# Patient Record
Sex: Female | Born: 1955
Health system: Southern US, Community
[De-identification: ages and names within clinical notes are randomized; demographics above are authoritative.]

## PROBLEM LIST (undated history)

## (undated) DIAGNOSIS — R51 Headache: Secondary | ICD-10-CM

## (undated) DIAGNOSIS — M1990 Unspecified osteoarthritis, unspecified site: Secondary | ICD-10-CM

## (undated) DIAGNOSIS — R269 Unspecified abnormalities of gait and mobility: Secondary | ICD-10-CM

## (undated) DIAGNOSIS — E039 Hypothyroidism, unspecified: Secondary | ICD-10-CM

## (undated) DIAGNOSIS — I10 Essential (primary) hypertension: Secondary | ICD-10-CM

## (undated) DIAGNOSIS — G4733 Obstructive sleep apnea (adult) (pediatric): Secondary | ICD-10-CM

## (undated) DIAGNOSIS — J189 Pneumonia, unspecified organism: Secondary | ICD-10-CM

## (undated) DIAGNOSIS — Z91199 Patient's noncompliance with other medical treatment and regimen due to unspecified reason: Secondary | ICD-10-CM

## (undated) DIAGNOSIS — I209 Angina pectoris, unspecified: Secondary | ICD-10-CM

## (undated) DIAGNOSIS — E785 Hyperlipidemia, unspecified: Secondary | ICD-10-CM

## (undated) DIAGNOSIS — IMO0001 Reserved for inherently not codable concepts without codable children: Secondary | ICD-10-CM

## (undated) DIAGNOSIS — I890 Lymphedema, not elsewhere classified: Secondary | ICD-10-CM

## (undated) DIAGNOSIS — R519 Headache, unspecified: Secondary | ICD-10-CM

## (undated) HISTORY — PX: SHOULDER SURGERY: SHX246

## (undated) HISTORY — DX: Patient's noncompliance with other medical treatment and regimen due to unspecified reason: Z91.199

## (undated) HISTORY — DX: Unspecified abnormalities of gait and mobility: R26.9

## (undated) HISTORY — PX: FOOT ARTHRODESIS, MODIFIED MCBRIDE: SUR52

## (undated) HISTORY — DX: Morbid (severe) obesity due to excess calories: E66.01

## (undated) HISTORY — DX: Unspecified osteoarthritis, unspecified site: M19.90

## (undated) HISTORY — PX: JOINT REPLACEMENT: SHX530

## (undated) HISTORY — DX: Obstructive sleep apnea (adult) (pediatric): G47.33

## (undated) HISTORY — PX: FOOT SURGERY: SHX648

## (undated) HISTORY — DX: Lymphedema, not elsewhere classified: I89.0

## (undated) HISTORY — PX: CARPAL TUNNEL RELEASE: SHX101

## (undated) HISTORY — PX: KNEE SURGERY: SHX244

## (undated) HISTORY — PX: ROTATOR CUFF REPAIR: SHX139

---

## 2012-09-18 DIAGNOSIS — K219 Gastro-esophageal reflux disease without esophagitis: Secondary | ICD-10-CM | POA: Insufficient documentation

## 2013-02-24 DIAGNOSIS — R519 Headache, unspecified: Secondary | ICD-10-CM | POA: Insufficient documentation

## 2014-02-03 DIAGNOSIS — F419 Anxiety disorder, unspecified: Secondary | ICD-10-CM | POA: Insufficient documentation

## 2014-02-03 DIAGNOSIS — R202 Paresthesia of skin: Secondary | ICD-10-CM | POA: Insufficient documentation

## 2014-02-03 DIAGNOSIS — G479 Sleep disorder, unspecified: Secondary | ICD-10-CM | POA: Insufficient documentation

## 2014-02-03 DIAGNOSIS — M21622 Bunionette of left foot: Secondary | ICD-10-CM | POA: Insufficient documentation

## 2014-02-03 DIAGNOSIS — D126 Benign neoplasm of colon, unspecified: Secondary | ICD-10-CM | POA: Insufficient documentation

## 2014-02-03 DIAGNOSIS — M222X9 Patellofemoral disorders, unspecified knee: Secondary | ICD-10-CM | POA: Insufficient documentation

## 2014-02-03 DIAGNOSIS — K589 Irritable bowel syndrome without diarrhea: Secondary | ICD-10-CM | POA: Insufficient documentation

## 2014-02-03 DIAGNOSIS — M1990 Unspecified osteoarthritis, unspecified site: Secondary | ICD-10-CM | POA: Insufficient documentation

## 2014-02-03 DIAGNOSIS — M7740 Metatarsalgia, unspecified foot: Secondary | ICD-10-CM | POA: Insufficient documentation

## 2014-02-03 DIAGNOSIS — R1011 Right upper quadrant pain: Secondary | ICD-10-CM | POA: Insufficient documentation

## 2014-08-08 ENCOUNTER — Observation Stay (HOSPITAL_BASED_OUTPATIENT_CLINIC_OR_DEPARTMENT_OTHER)
Admission: EM | Admit: 2014-08-08 | Discharge: 2014-08-10 | Disposition: A | Discharge status: Home / Self Care | Payer: Medicare HMO | Attending: Internal Medicine | Admitting: Internal Medicine

## 2014-08-08 ENCOUNTER — Encounter (HOSPITAL_BASED_OUTPATIENT_CLINIC_OR_DEPARTMENT_OTHER): Payer: Self-pay | Admitting: *Deleted

## 2014-08-08 DIAGNOSIS — N200 Calculus of kidney: Secondary | ICD-10-CM

## 2014-08-08 DIAGNOSIS — E876 Hypokalemia: Secondary | ICD-10-CM | POA: Diagnosis present

## 2014-08-08 DIAGNOSIS — R079 Chest pain, unspecified: Secondary | ICD-10-CM | POA: Diagnosis present

## 2014-08-08 DIAGNOSIS — D72829 Elevated white blood cell count, unspecified: Secondary | ICD-10-CM

## 2014-08-08 DIAGNOSIS — R1032 Left lower quadrant pain: Secondary | ICD-10-CM | POA: Diagnosis not present

## 2014-08-08 DIAGNOSIS — N390 Urinary tract infection, site not specified: Secondary | ICD-10-CM | POA: Diagnosis present

## 2014-08-08 DIAGNOSIS — K5900 Constipation, unspecified: Secondary | ICD-10-CM | POA: Diagnosis present

## 2014-08-08 DIAGNOSIS — I10 Essential (primary) hypertension: Secondary | ICD-10-CM | POA: Diagnosis not present

## 2014-08-08 DIAGNOSIS — R109 Unspecified abdominal pain: Secondary | ICD-10-CM

## 2014-08-08 DIAGNOSIS — E785 Hyperlipidemia, unspecified: Secondary | ICD-10-CM

## 2014-08-08 DIAGNOSIS — N3000 Acute cystitis without hematuria: Secondary | ICD-10-CM | POA: Insufficient documentation

## 2014-08-08 HISTORY — DX: Essential (primary) hypertension: I10

## 2014-08-08 HISTORY — DX: Hyperlipidemia, unspecified: E78.5

## 2014-08-08 HISTORY — DX: Pneumonia, unspecified organism: J18.9

## 2014-08-08 HISTORY — DX: Hypothyroidism, unspecified: E03.9

## 2014-08-08 HISTORY — DX: Unspecified osteoarthritis, unspecified site: M19.90

## 2014-08-08 HISTORY — DX: Headache: R51

## 2014-08-08 HISTORY — DX: Headache, unspecified: R51.9

## 2014-08-08 HISTORY — DX: Angina pectoris, unspecified: I20.9

## 2014-08-08 HISTORY — DX: Reserved for inherently not codable concepts without codable children: IMO0001

## 2014-08-08 LAB — URINALYSIS, ROUTINE W REFLEX MICROSCOPIC
Bilirubin Urine: NEGATIVE
Glucose, UA: NEGATIVE mg/dL
Ketones, ur: NEGATIVE mg/dL
Nitrite: NEGATIVE
PH: 7.5 (ref 5.0–8.0)
PROTEIN: NEGATIVE mg/dL
SPECIFIC GRAVITY, URINE: 1.019 (ref 1.005–1.030)
Urobilinogen, UA: 1 mg/dL (ref 0.0–1.0)

## 2014-08-08 LAB — URINE MICROSCOPIC-ADD ON

## 2014-08-08 NOTE — ED Notes (Signed)
Abdominal pain, back pain and nausea. Dysuria.

## 2014-08-09 ENCOUNTER — Encounter (HOSPITAL_COMMUNITY): Payer: Self-pay | Admitting: Internal Medicine

## 2014-08-09 ENCOUNTER — Emergency Department (HOSPITAL_BASED_OUTPATIENT_CLINIC_OR_DEPARTMENT_OTHER): Payer: Medicare HMO

## 2014-08-09 ENCOUNTER — Observation Stay (HOSPITAL_COMMUNITY): Payer: Medicare HMO

## 2014-08-09 DIAGNOSIS — R072 Precordial pain: Secondary | ICD-10-CM | POA: Diagnosis not present

## 2014-08-09 DIAGNOSIS — N3000 Acute cystitis without hematuria: Secondary | ICD-10-CM | POA: Insufficient documentation

## 2014-08-09 DIAGNOSIS — D72829 Elevated white blood cell count, unspecified: Secondary | ICD-10-CM

## 2014-08-09 DIAGNOSIS — I10 Essential (primary) hypertension: Secondary | ICD-10-CM

## 2014-08-09 DIAGNOSIS — R1032 Left lower quadrant pain: Secondary | ICD-10-CM

## 2014-08-09 DIAGNOSIS — N390 Urinary tract infection, site not specified: Secondary | ICD-10-CM | POA: Diagnosis present

## 2014-08-09 DIAGNOSIS — K5900 Constipation, unspecified: Secondary | ICD-10-CM | POA: Diagnosis not present

## 2014-08-09 DIAGNOSIS — R079 Chest pain, unspecified: Secondary | ICD-10-CM | POA: Diagnosis present

## 2014-08-09 DIAGNOSIS — E785 Hyperlipidemia, unspecified: Secondary | ICD-10-CM

## 2014-08-09 DIAGNOSIS — E876 Hypokalemia: Secondary | ICD-10-CM | POA: Diagnosis not present

## 2014-08-09 LAB — CBC WITH DIFFERENTIAL/PLATELET
Basophils Absolute: 0 10*3/uL (ref 0.0–0.1)
Basophils Absolute: 0.1 10*3/uL (ref 0.0–0.1)
Basophils Relative: 0 % (ref 0–1)
Basophils Relative: 0 % (ref 0–1)
Eosinophils Absolute: 0.2 10*3/uL (ref 0.0–0.7)
Eosinophils Absolute: 0.3 10*3/uL (ref 0.0–0.7)
Eosinophils Relative: 2 % (ref 0–5)
Eosinophils Relative: 2 % (ref 0–5)
HCT: 45.1 % (ref 36.0–46.0)
HEMATOCRIT: 41.8 % (ref 36.0–46.0)
HEMOGLOBIN: 15 g/dL (ref 12.0–15.0)
Hemoglobin: 13.6 g/dL (ref 12.0–15.0)
LYMPHS ABS: 1.9 10*3/uL (ref 0.7–4.0)
LYMPHS PCT: 14 % (ref 12–46)
LYMPHS PCT: 17 % (ref 12–46)
Lymphs Abs: 2 10*3/uL (ref 0.7–4.0)
MCH: 29.4 pg (ref 26.0–34.0)
MCH: 30 pg (ref 26.0–34.0)
MCHC: 33.3 g/dL (ref 30.0–36.0)
MCHC: 32.5 g/dL (ref 30.0–36.0)
MCV: 90.2 fL (ref 78.0–100.0)
MCV: 90.5 fL (ref 78.0–100.0)
Monocytes Absolute: 1.2 10*3/uL — ABNORMAL HIGH (ref 0.1–1.0)
Monocytes Absolute: 1 10*3/uL (ref 0.1–1.0)
Monocytes Relative: 8 % (ref 3–12)
Monocytes Relative: 9 % (ref 3–12)
Neutro Abs: 11.2 10*3/uL — ABNORMAL HIGH (ref 1.7–7.7)
Neutro Abs: 8.2 10*3/uL — ABNORMAL HIGH (ref 1.7–7.7)
Neutrophils Relative %: 76 % (ref 43–77)
Neutrophils Relative %: 72 % (ref 43–77)
PLATELETS: 201 10*3/uL (ref 150–400)
Platelets: 202 10*3/uL (ref 150–400)
RBC: 4.62 MIL/uL (ref 3.87–5.11)
RBC: 5 MIL/uL (ref 3.87–5.11)
RDW: 13.7 % (ref 11.5–15.5)
RDW: 13.7 % (ref 11.5–15.5)
WBC: 14.7 10*3/uL — ABNORMAL HIGH (ref 4.0–10.5)
WBC: 11.4 10*3/uL — AB (ref 4.0–10.5)

## 2014-08-09 LAB — BASIC METABOLIC PANEL
Anion gap: 7 (ref 5–15)
BUN: 12 mg/dL (ref 6–23)
CO2: 27 mmol/L (ref 19–32)
Calcium: 8.8 mg/dL (ref 8.4–10.5)
Chloride: 105 mmol/L (ref 96–112)
Creatinine, Ser: 0.7 mg/dL (ref 0.50–1.10)
GLUCOSE: 117 mg/dL — AB (ref 70–99)
POTASSIUM: 3.3 mmol/L — AB (ref 3.5–5.1)
Sodium: 139 mmol/L (ref 135–145)

## 2014-08-09 LAB — TROPONIN I: Troponin I: <0.03 ng/mL (ref <0.031)

## 2014-08-09 LAB — LACTIC ACID, PLASMA
Lactic Acid, Venous: 0.6 mmol/L (ref 0.5–2.0)
Lactic Acid, Venous: 1.6 mmol/L (ref 0.5–2.0)

## 2014-08-09 LAB — COMPREHENSIVE METABOLIC PANEL
ALT: 13 U/L (ref 0–35)
AST: 17 U/L (ref 0–37)
Albumin: 3.1 g/dL — ABNORMAL LOW (ref 3.5–5.2)
Alkaline Phosphatase: 86 U/L (ref 39–117)
Anion gap: 10 (ref 5–15)
BUN: 9 mg/dL (ref 6–23)
CO2: 27 mmol/L (ref 19–32)
Calcium: 8.7 mg/dL (ref 8.4–10.5)
Chloride: 104 mmol/L (ref 96–112)
Creatinine, Ser: 0.86 mg/dL (ref 0.50–1.10)
GFR calc non Af Amer: 74 mL/min — ABNORMAL LOW (ref >90)
GFR, EST AFRICAN AMERICAN: 85 mL/min — AB (ref >90)
GLUCOSE: 110 mg/dL — AB (ref 70–99)
Potassium: 3.5 mmol/L (ref 3.5–5.1)
SODIUM: 141 mmol/L (ref 135–145)
TOTAL PROTEIN: 6.5 g/dL (ref 6.0–8.3)
Total Bilirubin: 0.8 mg/dL (ref 0.3–1.2)

## 2014-08-09 LAB — C-REACTIVE PROTEIN: CRP: 10.5 mg/dL — ABNORMAL HIGH (ref <0.60)

## 2014-08-09 LAB — BRAIN NATRIURETIC PEPTIDE: B NATRIURETIC PEPTIDE 5: 35 pg/mL (ref 0.0–100.0)

## 2014-08-09 LAB — SEDIMENTATION RATE: Sed Rate: 47 mm/hr — ABNORMAL HIGH (ref 0–22)

## 2014-08-09 LAB — D-DIMER, QUANTITATIVE (NOT AT ARMC)

## 2014-08-09 MED ORDER — CEFTRIAXONE SODIUM 1 G IJ SOLR
INTRAMUSCULAR | Status: AC
  Filled 2014-08-09: qty 10

## 2014-08-09 MED ORDER — FENTANYL CITRATE (PF) 100 MCG/2ML IJ SOLN
50.0000 ug | Freq: Once | INTRAMUSCULAR | Status: AC
Start: 2014-08-09 — End: 2014-08-09
  Administered 2014-08-09: 50 ug via INTRAVENOUS
  Filled 2014-08-09: qty 2

## 2014-08-09 MED ORDER — ONDANSETRON HCL 4 MG/2ML IJ SOLN: 4.0000 mg | Freq: Four times a day (QID) | INTRAMUSCULAR | Status: DC | PRN

## 2014-08-09 MED ORDER — NITROGLYCERIN 0.4 MG SL SUBL
0.4000 mg | SUBLINGUAL_TABLET | SUBLINGUAL | Status: AC | PRN
  Administered 2014-08-09 (×3): 0.4 mg via SUBLINGUAL
  Filled 2014-08-09 (×2): qty 1

## 2014-08-09 MED ORDER — ENOXAPARIN SODIUM 40 MG/0.4ML ~~LOC~~ SOLN
40.0000 mg | SUBCUTANEOUS | Status: DC
  Administered 2014-08-09 – 2014-08-10 (×2): 40 mg via SUBCUTANEOUS
  Filled 2014-08-09 (×2): qty 0.4

## 2014-08-09 MED ORDER — MORPHINE SULFATE 2 MG/ML IJ SOLN: 2.0000 mg | INTRAMUSCULAR | Status: DC | PRN

## 2014-08-09 MED ORDER — CEFTRIAXONE SODIUM IN DEXTROSE 40 MG/ML IV SOLN
2.0000 g | INTRAVENOUS | Status: DC
  Administered 2014-08-09: 2 g via INTRAVENOUS
  Filled 2014-08-09 (×2): qty 50

## 2014-08-09 MED ORDER — ONDANSETRON HCL 4 MG/2ML IJ SOLN
4.0000 mg | Freq: Once | INTRAMUSCULAR | Status: AC
  Administered 2014-08-09: 4 mg via INTRAVENOUS
  Filled 2014-08-09: qty 2

## 2014-08-09 MED ORDER — ASPIRIN EC 325 MG PO TBEC
325.0000 mg | DELAYED_RELEASE_TABLET | Freq: Every day | ORAL | Status: DC
  Administered 2014-08-09 – 2014-08-10 (×2): 325 mg via ORAL
  Filled 2014-08-09 (×2): qty 1

## 2014-08-09 MED ORDER — ASPIRIN 81 MG PO CHEW
324.0000 mg | CHEWABLE_TABLET | Freq: Once | ORAL | Status: AC
  Administered 2014-08-09: 324 mg via ORAL
  Filled 2014-08-09: qty 4

## 2014-08-09 MED ORDER — TAMSULOSIN HCL 0.4 MG PO CAPS
0.4000 mg | ORAL_CAPSULE | Freq: Every day | ORAL | Status: DC
  Administered 2014-08-09 – 2014-08-10 (×2): 0.4 mg via ORAL
  Filled 2014-08-09 (×2): qty 1

## 2014-08-09 MED ORDER — ACETAMINOPHEN 325 MG PO TABS
650.0000 mg | ORAL_TABLET | ORAL | Status: DC | PRN
  Administered 2014-08-09 – 2014-08-10 (×2): 650 mg via ORAL
  Filled 2014-08-09 (×3): qty 2

## 2014-08-09 MED ORDER — DEXTROSE 5 % IV SOLN
1.0000 g | Freq: Once | INTRAVENOUS | Status: AC
  Administered 2014-08-09: 1 g via INTRAVENOUS

## 2014-08-09 MED ORDER — KETOROLAC TROMETHAMINE 15 MG/ML IJ SOLN
15.0000 mg | Freq: Four times a day (QID) | INTRAMUSCULAR | Status: DC | PRN
  Administered 2014-08-09 – 2014-08-10 (×2): 15 mg via INTRAVENOUS
  Filled 2014-08-09 (×2): qty 1

## 2014-08-09 NOTE — ED Notes (Signed)
Pt with decrease in SPO2 89% placed on 2l Backus

## 2014-08-09 NOTE — Consult Note (Signed)
CARDIOLOGY CONSULT NOTE   Patient ID: Betty Miranda MRN: [id number] DOB/AGE: Sep 03, 1955 59 y.o.  Admit Date: 08/08/2014 Primary Physician: PROVIDER NOT IN SYSTEM  Primary Cardiologist     [place]   Clinical Summary Ms. Betty Miranda is a 59 y.o.female. I'm seeing her today for cardiology as part of her evaluation on the chest pain unit. She's not having any further chest pain this morning. Troponin is normal 2 so far. Her initial EKG revealed nonspecific ST-T wave changes. She had actually been having some abdominal pain. Then while coming the hospital with abdominal pain she noted some chest pain. She does have a history of hypertension and there is a family history of coronary disease.   Allergies  Allergen Reactions  . Sulfa Antibiotics     rash    Medications Scheduled Medications: . aspirin EC  325 mg Oral Daily  . cefTRIAXone      . cefTRIAXone (ROCEPHIN)  IV  2 g Intravenous Q24H  . enoxaparin (LOVENOX) injection  40 mg Subcutaneous Q24H     Infusions:     PRN Medications:  acetaminophen, morphine injection, ondansetron (ZOFRAN) IV   Past Medical History  Diagnosis Date  . Hypertension   . Anginal pain   . Shortness of breath dyspnea   . Pneumonia   . Headache   . Arthritis   . Dyslipidemia   . Hypothyroidism     Past Surgical History  Procedure Laterality Date  . Knee surgery    . Joint replacement    . Rotator cuff repair Left   . Shoulder surgery Right     to remove bone spur  . Carpal tunnel release Bilateral   . Foot arthrodesis, modified mcbride Left   . Foot surgery Right     Toe bone removal and spur removals    Family History  Problem Relation Age of Onset  . Stroke Mother   . Heart attack Father 40    died at 3467  . Stroke Sister     Social History Ms. Betty Miranda reports that she has never smoked. She does not have any smokeless tobacco history on file. Ms. Betty Miranda reports that she does not drink alcohol.  Review of Systems Complete  review of systems are found to be negative unless outlined in H&P above. Patient denies fever, chills, headache, sweats, rash, change in vision, change in hearing, cough. All other systems are reviewed and are negative.  Physical Examination Blood pressure 108/62, pulse 75, temperature 99.4 F (37.4 C), temperature source Oral, resp. rate 20, height 5\' 1"  (1.549 m), weight 273 lb (123.832 kg), SpO2 95 %. No intake or output data in the 24 hours ending 08/09/14 0827  Patient is oriented to person, place. Affect is normal. There is no jugulovenous distention. Lungs are clear. Respiratory effort is not labored. Cardiac exam reveals an S1 and S2. Abdomen is soft. There is no peripheral edema. There are no musculoskeletal deformities. There are no skin rashes. Neurologic is grossly intact.  Prior Cardiac Testing/Procedures  Lab Results  Basic Metabolic Panel:  Recent Labs Lab 08/09/14 0055  NA 139  K 3.3*  CL 105  CO2 27  GLUCOSE 117*  BUN 12  CREATININE 0.70  CALCIUM 8.8    Liver Function Tests: No results for input(s): AST, ALT, ALKPHOS, BILITOT, PROT, ALBUMIN in the last 168 hours.  CBC:  Recent Labs Lab 08/09/14 0055  WBC 14.7*  NEUTROABS 11.2*  HGB 15.0  HCT 45.1  MCV  90.2  PLT 202    Cardiac Enzymes:  Recent Labs Lab 08/09/14 0055  TROPONINI <0.03    BNP: Invalid input(s): POCBNP   Radiology: Dg Chest 2 View  08/09/2014   CLINICAL DATA:  59 year old female with left lower quadrant pain and nausea for the past 4 days  EXAM: CHEST  2 VIEW  COMPARISON:  None  FINDINGS: Cardiac and mediastinal contours are within normal limits. No focal airspace consolidation, pleural effusion, pulmonary edema or pneumothorax. No acute osseous abnormality. Unremarkable visualized upper abdominal bowel gas pattern. Mild central bronchitic change.  IMPRESSION: No active cardiopulmonary disease.   Electronically Signed   By: Malachy Moan M.D.   On: 08/09/2014 02:56    ECG:  I have reviewed the EKGs. There are nonspecific ST-T wave changes.  Telemetry:   I have reviewed telemetry today August 09, 2014. There is normal sinus rhythm.   Impression and Recommendations    Chest pain     So far she has 2 normal troponins. She needs a follow-up EKG. Two-dimensional echo has been ordered. At this point I think it is unlikely that her chest pain is cardiac in origin. If her third troponin is normal and if her 2 D echo shows normal LV function, further cardiac workup in the hospital will not be necessary. If her echo shows normal left ventricular function, more complete evaluation can be considered. Because of her risk factors and nonspecific ST-T wave changes, follow-up outpatient pharmacologic nuclear stress test can be considered. I could then see her in the office one week later in the [facility] cardiology office.    Abdominal pain, left lower quadrant     This was actually her presenting symptom. The primary care team is following this up.    Hypokalemia   UTI (urinary tract infection)   Constipation   Elevated WBC count   Essential hypertension   Hyperlipidemia  Jerral Bonito, MD 08/09/2014, 8:27 AM

## 2014-08-09 NOTE — H&P (Signed)
[facility] History and Physical  Patient: Betty Miranda  MRN: [id number]  DOB: [birth year]  DOS: the patient was seen and examined on 08/09/2014 PCP: PROVIDER NOT IN SYSTEM  Referring physician: Mortimer Fries  Chief Complaint: Chest pain and abdominal pain  HPI: Betty Miranda is a 58 y.o. female with Past medical history of essential hypertension, dyslipidemia, hypothyroidism. The patient is presenting with complaints of chest pain as well as left lower quadrant pain. She mentions that since last 2 days she has been noticing left lower quadrant pain which is sharp cramping in nature and is constant. At the time of my evaluation it was 8 out of 10. She does not have any bowel movement in the last 2 days. She is passing gas. Denies any nausea or vomiting or abdominal pain elsewhere. Does have history of colonoscopy but does not remember having diverticulosis. Does not have any similar pain in the past. She was diagnosed with a possible UTI in the urgent care. She does not have recurrent UTI. While she was coming to the urgent care she was started having complaints of chest pain which was heaviness across her chest. This was associated with shortness of breath. She denies any similar chest pain in the past. She does have history of stress test as well as an angiogram but does not have any stents. She does have history of hypothyroidism as well as dyslipidemia but does not take any medications.  The patient is coming from home. And at her baseline independent for most of her ADL.  Review of Systems: as mentioned in the history of present illness.  A comprehensive review of the other systems is negative.  Past Medical History  Diagnosis Date  . Hypertension    Past Surgical History  Procedure Laterality Date  . Knee surgery     Social History:  reports that she has never smoked. She does not have any smokeless tobacco history on file. She reports that she does not drink  alcohol or use illicit drugs.  Allergies  Allergen Reactions  . Sulfa Antibiotics     rash    No family history on file.  Prior to Admission medications   Medication Sig Start Date End Date Taking? Authorizing Provider  ATENOLOL PO Take by mouth.   Yes Historical Provider, MD    Physical Exam: Filed Vitals:   08/09/14 0330 08/09/14 0400 08/09/14 0426 08/09/14 0538  BP: 116/70 122/72 124/67 108/62  Pulse: 84 87 83 75  Temp:      TempSrc:      Resp: Height:      Weight:      SpO2: 92% 95% 95% 95%    General: Alert, Awake and Oriented to Time, Place and Person. Appear in mild distress Eyes: PERRL ENT: Oral Mucosa clear moist. Neck: no JVD Cardiovascular: S1 and S2 Present, no Murmur, Peripheral Pulses Present Respiratory: Bilateral Air entry equal and Decreased,  Clear to Auscultation, no Crackles, no wheezes Abdomen: Bowel Sound present , Soft and  left lower quadrant tnder Skin: no Rash Extremities: no Pedal edema, no calf tenderness Neurologic: Grossly no focal neuro deficit.  Labs on Admission:  CBC:  Recent Labs Lab 08/09/14 0055  WBC 14.7*  NEUTROABS 11.2*  HGB 15.0  HCT 45.1  MCV 90.2  PLT 202    CMP     Component Value Date/Time   NA 139 08/09/2014 0055   K 3.3* 08/09/2014 0055   CL 105 08/09/2014  0055   CO2 27 08/09/2014 0055   GLUCOSE 117* 08/09/2014 0055   BUN 12 08/09/2014 0055   CREATININE 0.70 08/09/2014 0055   CALCIUM 8.8 08/09/2014 0055   GFRNONAA >90 08/09/2014 0055   GFRAA >90 08/09/2014 0055    No results for input(s): LIPASE, AMYLASE in the last 168 hours.   Recent Labs Lab 08/09/14 0055  TROPONINI <0.03   BNP (last 3 results)  Recent Labs  08/09/14 0055  BNP 35.0    ProBNP (last 3 results) No results for input(s): PROBNP in the last 8760 hours.   Radiological Exams on Admission: Dg Chest 2 View  08/09/2014   CLINICAL DATA:  59 year old female with left lower quadrant pain and nausea for the past  4 days  EXAM: CHEST  2 VIEW  COMPARISON:  None  FINDINGS: Cardiac and mediastinal contours are within normal limits. No focal airspace consolidation, pleural effusion, pulmonary edema or pneumothorax. No acute osseous abnormality. Unremarkable visualized upper abdominal bowel gas pattern. Mild central bronchitic change.  IMPRESSION: No active cardiopulmonary disease.   Electronically Signed   By: Malachy MoanHeath  McCullough M.D.   On: 08/09/2014 02:56   EKG: Independently reviewed. normal sinus rhythm, nonspecific ST and T waves changes.  Assessment/Plan Principal Problem:   Chest pain Active Problems:   Abdominal pain, left lower quadrant   Hypokalemia   UTI (urinary tract infection)   Constipation   1. Chest pain The patient presents with chest pain her initial EKG and troponin does not show any signs of acute ischemia. her chest x-ray is showing no acute abnormality. her d-dimer negative, and she is considered a low risk for VTE, therefore no further workup. At present I will admit the patient to the hospital for observation. I will obtain serial troponin every 6 hours, monitor on telemetry, get 2D echocardiogram in the morning to rule out ACS.  Continue with aspirin, beta blocker   2. UTI. We will continue with ceftriaxone.  3. Abdominal pain of the left lower quadrant. I would start with an x-ray of her abdomen. Continue with ceftriaxone. If her symptoms does not improve she may require a CAT scan of her abdomen to rule out any colonic abnormalities.  4. Hypokalemia. Replacing.  Consults: I discussed with cardiology PA  DVT Prophylaxis: subcutaneous Heparin Nutrition: Nothing by mouth except medications   Disposition: Admitted as observation, telemetry unit.  Author: Lynden OxfordPranav Arianie Couse, MD [facility] Pager: [phone] 08/09/2014  If 7PM-7AM, please contact night-coverage [email or web address] Password [email or web address]

## 2014-08-09 NOTE — Progress Notes (Signed)
Patient accepted via [facility] to [id number]. Chest pain free, however, complains of severe headache [facility]. Vital signs WDL for patient: Filed Vitals:   08/09/14 0538  BP: 108/62  Pulse: 75  Temp:   Resp: 20  SPO2:     95% on O2 at 2L/min. per nasal canula  Bed management contacted to notify admitting MD. Awaiting admission orders.  Continuing to monitor.

## 2014-08-09 NOTE — Progress Notes (Signed)
[facility] PROGRESS NOTE  Betty Miranda ZOX:[id number] DOB: [birth year] DOA: 08/08/2014 PCP: PROVIDER NOT IN SYSTEM  Assessment/Plan: 1. Chest pain 1. Presently pain free 2. Pt seen by Cardiology with recs for 2d echo, pending 3. If 2d echo normal, then recs for outpt stress test 4. Enzymes serially neg x 3 2. UTI 1. On empiric rocephin 2. Urine cx pending 3. L sided renal calculi with abd pain 1. 9mm stone on abd xray 2. Started flomax 3. Encourage adequate PO intake 4. Hypokalemia 1. Normalized this AM 5. DVT prophylaxis 1. Lovenox  Code Status: Full Family Communication: Pt in room (indicate person spoken with, relationship, and if by phone, the number) Disposition Plan: Pending   Consultants:  Cardiology  Procedures:    Antibiotics:   (indicate start date, and stop date if known)  HPI/Subjective: Continues with L flank pain, denies chest pain currently  Objective: Filed Vitals:   08/09/14 0400 08/09/14 0426 08/09/14 0538 08/09/14 2000  BP: 122/72 124/67 108/62 119/55  Pulse: 87 83 75 67  Temp:    97.7 F (36.5 C)  TempSrc:    Axillary  Resp: Height:      Weight:      SpO2: 95% 95% 95% 97%   No intake or output data in the 24 hours ending 08/09/14 2005 Filed Weights   08/08/14 2136  Weight: 123.832 kg (273 lb)    Exam:   General:  Awake, in nad  Cardiovascular: regular, s1, s2  Respiratory: normal resp effort, no wheezing  Abdomen: soft,nondistended, L flank pain  Musculoskeletal: perfused, no clubbing   Data Reviewed: Basic Metabolic Panel:  Recent Labs Lab 08/09/14 0055 08/09/14 0810  NA 139 141  K 3.3* 3.5  CL 105 104  CO2 27 27  GLUCOSE 117* 110*  BUN 12 9  CREATININE 0.70 0.86  CALCIUM 8.8 8.7   Liver Function Tests:  Recent Labs Lab 08/09/14 0810  AST 17  ALT 13  ALKPHOS 86  BILITOT 0.8  PROT 6.5  ALBUMIN 3.1*   No results for input(s): LIPASE, AMYLASE in the last 168 hours. No results  for input(s): AMMONIA in the last 168 hours. CBC:  Recent Labs Lab 08/09/14 0055 08/09/14 0810  WBC 14.7* 11.4*  NEUTROABS 11.2* 8.2*  HGB 15.0 13.6  HCT 45.1 41.8  MCV 90.2 90.5  PLT 202 201   Cardiac Enzymes:  Recent Labs Lab 08/09/14 0055 08/09/14 0810 08/09/14 1244 08/09/14 1852  TROPONINI <0.03 <0.03 <0.03 <0.03   BNP (last 3 results)  Recent Labs  08/09/14 0055  BNP 35.0    ProBNP (last 3 results) No results for input(s): PROBNP in the last 8760 hours.  CBG: No results for input(s): GLUCAP in the last 168 hours.  No results found for this or any previous visit (from the past 240 hour(s)).   Studies: Dg Chest 2 View  08/09/2014   CLINICAL DATA:  59 year old female with left lower quadrant pain and nausea for the past 4 days  EXAM: CHEST  2 VIEW  COMPARISON:  None  FINDINGS: Cardiac and mediastinal contours are within normal limits. No focal airspace consolidation, pleural effusion, pulmonary edema or pneumothorax. No acute osseous abnormality. Unremarkable visualized upper abdominal bowel gas pattern. Mild central bronchitic change.  IMPRESSION: No active cardiopulmonary disease.   Electronically Signed   By: Malachy Moan M.D.   On: 08/09/2014 02:56   Dg Abd 2 Views  08/09/2014   CLINICAL DATA:  Chest  pain and left lower quadrant abdominal pain. Present for 2 days.  EXAM: ABDOMEN - 2 VIEW  COMPARISON:  None  FINDINGS: No intraperitoneal free air is identified. Gas is present in nondilated loops of small and large bowel to the level of the rectum without evidence of obstruction. There is a small amount of colonic stool present, greatest proximally. A 9 mm oblong calcification projects in the left mid abdomen and may be renal in origin. No acute osseous abnormality is seen.  IMPRESSION: 1. Nonobstructed bowel-gas pattern. 2. Possible 9 mm left renal calculus.   Electronically Signed   By: Sebastian AcheAllen  Grady   On: 08/09/2014 08:56    Scheduled Meds: . aspirin EC  325  mg Oral Daily  . cefTRIAXone (ROCEPHIN)  IV  2 g Intravenous Q24H  . enoxaparin (LOVENOX) injection  40 mg Subcutaneous Q24H  . tamsulosin  0.4 mg Oral Daily   Continuous Infusions:   Principal Problem:   Chest pain Active Problems:   Abdominal pain, left lower quadrant   Hypokalemia   UTI (urinary tract infection)   Constipation   Elevated WBC count   Essential hypertension   Hyperlipidemia    CHIU, STEPHEN K  [facility] Pager [phone]. If 7PM-7AM, please contact night-coverage at [email or web address], password [email or web address] 08/09/2014, 8:05 PM  LOS: 0 days

## 2014-08-09 NOTE — Progress Notes (Signed)
ANTIBIOTIC CONSULT NOTE - INITIAL  Pharmacy Consult for Ceftriaxone Indication: UTI  Allergies  Allergen Reactions  . Sulfa Antibiotics     rash   Patient Measurements: Height: 5\' 1"  (154.9 cm) Weight: 273 lb (123.832 kg) IBW/kg (Calculated) : 47.8  Vital Signs: Temp: 99.4 F (37.4 C) (04/26 2136) Temp Source: Oral (04/26 2136) BP: 108/62 mmHg (04/27 0538) Pulse Rate: 75 (04/27 0538)  Labs:  Recent Labs  08/09/14 0055  WBC 14.7*  HGB 15.0  PLT 202  CREATININE 0.70   Estimated Creatinine Clearance: 95.8 mL/min (by C-G formula based on Cr of 0.7).  Medical History: Past Medical History  Diagnosis Date  . Hypertension   . Anginal pain   . Shortness of breath dyspnea   . Pneumonia   . Headache   . Arthritis   . Dyslipidemia   . Hypothyroidism    Assessment: Ceftriaxone for UTI, WBC mildly elevated, renal function good, other labs as above  Plan:  -Ceftriaxone 2g IV q24h -Trend WBC, temp, urine cultures  Abran DukeLedford, Sherisse Fullilove 08/09/2014,7:33 AM

## 2014-08-09 NOTE — ED Notes (Signed)
[facility] Denies cp at this time.

## 2014-08-09 NOTE — Evaluation (Signed)
EDP: [facility] Reason for Admission: Chest Pain, UTI Patient initially presented with UTI symptoms. Started on Rocephin. Urine culture. Patient also with multiple episodes of central chest pain today. Given nitroglycerin in the ER with resolution of symptoms. Troponin negative 1. EKG within normal limits. Heart score around 5. Admitted for chest pain rule out. Otherwise hemodynamically stable and chest pain-free at this point. Given full dose aspirin. Accepted to telemetry bed.

## 2014-08-09 NOTE — ED Notes (Signed)
Bed assignment changed to 3 west. Report called to [facility]. Pt en route via [facility].

## 2014-08-09 NOTE — ED Provider Notes (Signed)
CSN: [id number]     Arrival date & time 08/08/14  2123 History   First MD Initiated Contact with Patient 08/09/14 0020     Chief Complaint  Patient presents with  . Abdominal Pain     (Consider location/radiation/quality/duration/timing/severity/associated sxs/prior Treatment) HPI  This is a 59 year old female with a two-day history of left suprapubic pain. Pain is moderate and worse with movement, palpation are at the end of urination. She has had a subjective fever and chills. She has had nausea but no vomiting. She denies urinary urgency or frequency. She is also having low back pain.  On the way to the ED she developed chest pain which she describes as a pressure. It is moderate in intensity and is associated with subjective shortness of breath.  Past Medical History  Diagnosis Date  . Hypertension    Past Surgical History  Procedure Laterality Date  . Knee surgery     No family history on file. History  Substance Use Topics  . Smoking status: Never Smoker   . Smokeless tobacco: Not on file  . Alcohol Use: No   OB History    No data available     Review of Systems  All other systems reviewed and are negative.   Allergies  Sulfa antibiotics  Home Medications   Prior to Admission medications   Medication Sig Start Date End Date Taking? Authorizing Provider  ATENOLOL PO Take by mouth.   Yes Historical Provider, MD   BP 103/60 mmHg  Pulse 82  Temp(Src) 99.4 F (37.4 C) (Oral)  Resp 24  Ht  (1.549 m)  Wt 273 lb (123.832 kg)  BMI 51.61 kg/m2  SpO2 95%   Physical Exam  General: Well-developed, well-nourished female in no acute distress; appearance consistent with age of record HENT: normocephalic; atraumatic Eyes: pupils equal, round and reactive to light; extraocular muscles intact Neck: supple Heart: regular rate and rhythm; no murmurs, rubs or gallops  Chest: Bilateral paraspinal tenderness which does not reproduce the pain she reports in the  history of present illness Lungs: clear to auscultation bilaterally Abdomen: soft; nondistended; suprapubic tenderness; no masses or hepatosplenomegaly; bowel sounds present Extremities: No deformity; full range of motion; pulses normal Neurologic: Awake, alert and oriented; motor function intact in all extremities and symmetric; no facial droop Skin: Warm and dry Psychiatric: Normal mood and affect    ED Course  Procedures (including critical care time)   MDM   Nursing notes and vitals signs, including pulse oximetry, reviewed.  Summary of this visit's results, reviewed by myself:   EKG Interpretation  Date/Time:  Wednesday August 09 2014 00:31:21 EDT Ventricular Rate:  95 PR Interval:  146 QRS Duration: 82 QT Interval:  372 QTC Calculation: 467 R Axis:   32 Text Interpretation:  Normal sinus rhythm Low voltage QRS Nonspecific ST and T wave abnormality Abnormal ECG No previous ECGs available Confirmed by Tamilyn Lupien  MD, Jonny Ruiz ([id number]) on 08/09/2014 12:32:05 AM       Labs:  Results for orders placed or performed during the hospital encounter of 08/08/14 (from the past 24 hour(s))  Urinalysis, Routine w reflex microscopic     Status: Abnormal   Collection Time: 08/08/14  9:37 PM  Result Value Ref Range   Color, Urine YELLOW YELLOW   APPearance CLOUDY (A) CLEAR   Specific Gravity, Urine 1.019 1.005 - 1.030   pH 7.5 5.0 - 8.0   Glucose, UA NEGATIVE NEGATIVE mg/dL   Hgb urine dipstick TRACE (A)  NEGATIVE   Bilirubin Urine NEGATIVE NEGATIVE   Ketones, ur NEGATIVE NEGATIVE mg/dL   Protein, ur NEGATIVE NEGATIVE mg/dL   Urobilinogen, UA 1.0 0.0 - 1.0 mg/dL   Nitrite NEGATIVE NEGATIVE   Leukocytes, UA LARGE (A) NEGATIVE  Urine microscopic-add on     Status: Abnormal   Collection Time: 08/08/14  9:37 PM  Result Value Ref Range   Squamous Epithelial / LPF MANY (A) RARE   WBC, UA TOO NUMEROUS TO COUNT <3 WBC/hpf   RBC / HPF 7-10 <3 RBC/hpf   Bacteria, UA MANY (A) RARE  CBC with  Differential/Platelet     Status: Abnormal   Collection Time: 08/09/14 12:55 AM  Result Value Ref Range   WBC 14.7 (H) 4.0 - 10.5 K/uL   RBC 5.00 3.87 - 5.11 MIL/uL   Hemoglobin 15.0 12.0 - 15.0 g/dL   HCT 16.145.1 09.636.0 - 04.546.0 %   MCV 90.2 78.0 - 100.0 fL   MCH 30.0 26.0 - 34.0 pg   MCHC 33.3 30.0 - 36.0 g/dL   RDW 40.913.7 81.111.5 - 91.415.5 %   Platelets 202 150 - 400 K/uL   Neutrophils Relative % 76 43 - 77 %   Neutro Abs 11.2 (H) 1.7 - 7.7 K/uL   Lymphocytes Relative 14 12 - 46 %   Lymphs Abs 2.0 0.7 - 4.0 K/uL   Monocytes Relative 8 3 - 12 %   Monocytes Absolute 1.2 (H) 0.1 - 1.0 K/uL   Eosinophils Relative 2 0 - 5 %   Eosinophils Absolute 0.2 0.0 - 0.7 K/uL   Basophils Relative 0 0 - 1 %   Basophils Absolute 0.0 0.0 - 0.1 K/uL  Basic metabolic panel     Status: Abnormal   Collection Time: 08/09/14 12:55 AM  Result Value Ref Range   Sodium 139 135 - 145 mmol/L   Potassium 3.3 (L) 3.5 - 5.1 mmol/L   Chloride 105 96 - 112 mmol/L   CO2 27 19 - 32 mmol/L   Glucose, Bld 117 (H) 70 - 99 mg/dL   BUN 12 6 - 23 mg/dL   Creatinine, Ser 7.820.70 0.50 - 1.10 mg/dL   Calcium 8.8 8.4 - 95.610.5 mg/dL   GFR calc non Af Amer >90 >90 mL/min   GFR calc Af Amer >90 >90 mL/min   Anion gap 7 5 - 15  Troponin I     Status: None   Collection Time: 08/09/14 12:55 AM  Result Value Ref Range   Troponin I <0.03 <0.031 ng/mL  D-dimer, quantitative     Status: None   Collection Time: 08/09/14 12:55 AM  Result Value Ref Range   D-Dimer, Quant <0.27 0.00 - 0.48 ug/mL-FEU  Brain natriuretic peptide     Status: None   Collection Time: 08/09/14 12:55 AM  Result Value Ref Range   B Natriuretic Peptide 35.0 0.0 - 100.0 pg/mL    Imaging Studies: Dg Chest 2 View  08/09/2014   CLINICAL DATA:  59 year old female with left lower quadrant pain and nausea for the past 4 days  EXAM: CHEST  2 VIEW  COMPARISON:  None  FINDINGS: Cardiac and mediastinal contours are within normal limits. No focal airspace consolidation, pleural  effusion, pulmonary edema or pneumothorax. No acute osseous abnormality. Unremarkable visualized upper abdominal bowel gas pattern. Mild central bronchitic change.  IMPRESSION: No active cardiopulmonary disease.   Electronically Signed   By: Malachy MoanHeath  McCullough M.D.   On: 08/09/2014 02:56    1:49 AM Chest pain  relieved with nitroglycerin. Rocephin 1 gram IV given for urinary tract infection.  3:24 AM Continues to be pain-free. We'll have admitted for chest pain.     Paula Libra, MD 08/09/14 [phone]

## 2014-08-09 NOTE — ED Notes (Signed)
Pt c/o cp treated with ntg decrease from 3/10 to 0/10 [facility] at bedside. Pt also c/o abd pain Fentanyl given 50mcg. [facility]

## 2014-08-10 ENCOUNTER — Other Ambulatory Visit: Payer: Self-pay | Admitting: Physician Assistant

## 2014-08-10 DIAGNOSIS — E876 Hypokalemia: Secondary | ICD-10-CM | POA: Diagnosis not present

## 2014-08-10 DIAGNOSIS — D72829 Elevated white blood cell count, unspecified: Secondary | ICD-10-CM

## 2014-08-10 DIAGNOSIS — I10 Essential (primary) hypertension: Secondary | ICD-10-CM

## 2014-08-10 DIAGNOSIS — N200 Calculus of kidney: Secondary | ICD-10-CM

## 2014-08-10 DIAGNOSIS — R0789 Other chest pain: Secondary | ICD-10-CM

## 2014-08-10 DIAGNOSIS — R079 Chest pain, unspecified: Secondary | ICD-10-CM | POA: Diagnosis not present

## 2014-08-10 DIAGNOSIS — R1032 Left lower quadrant pain: Secondary | ICD-10-CM | POA: Diagnosis not present

## 2014-08-10 LAB — URINE CULTURE

## 2014-08-10 LAB — CBC
HCT: 38.9 % (ref 36.0–46.0)
HEMOGLOBIN: 12.4 g/dL (ref 12.0–15.0)
MCH: 29 pg (ref 26.0–34.0)
MCHC: 31.9 g/dL (ref 30.0–36.0)
MCV: 91.1 fL (ref 78.0–100.0)
Platelets: 187 10*3/uL (ref 150–400)
RBC: 4.27 MIL/uL (ref 3.87–5.11)
RDW: 13.8 % (ref 11.5–15.5)
WBC: 8.7 10*3/uL (ref 4.0–10.5)

## 2014-08-10 LAB — BASIC METABOLIC PANEL
Anion gap: 8 (ref 5–15)
BUN: 12 mg/dL (ref 6–23)
CO2: 28 mmol/L (ref 19–32)
Calcium: 8.5 mg/dL (ref 8.4–10.5)
Chloride: 103 mmol/L (ref 96–112)
Creatinine, Ser: 0.83 mg/dL (ref 0.50–1.10)
GFR, EST AFRICAN AMERICAN: 89 mL/min — AB (ref >90)
GFR, EST NON AFRICAN AMERICAN: 77 mL/min — AB (ref >90)
Glucose, Bld: 118 mg/dL — ABNORMAL HIGH (ref 70–99)
Potassium: 3.6 mmol/L (ref 3.5–5.1)
Sodium: 139 mmol/L (ref 135–145)

## 2014-08-10 MED ORDER — KETOROLAC TROMETHAMINE 10 MG PO TABS: 10.0000 mg | ORAL_TABLET | Freq: Four times a day (QID) | ORAL | Status: DC | PRN

## 2014-08-10 MED ORDER — LISINOPRIL-HYDROCHLOROTHIAZIDE 20-25 MG PO TABS: 1.0000 | ORAL_TABLET | Freq: Every day | ORAL | Status: DC

## 2014-08-10 MED ORDER — CEFUROXIME AXETIL 250 MG PO TABS: 250.0000 mg | ORAL_TABLET | Freq: Two times a day (BID) | ORAL | Status: DC

## 2014-08-10 MED ORDER — MAGNESIUM HYDROXIDE 400 MG/5ML PO SUSP
5.0000 mL | Freq: Every day | ORAL | Status: DC | PRN
  Filled 2014-08-10: qty 30

## 2014-08-10 MED ORDER — TAMSULOSIN HCL 0.4 MG PO CAPS: 0.4000 mg | ORAL_CAPSULE | Freq: Every day | ORAL | Status: DC

## 2014-08-10 NOTE — Progress Notes (Signed)
Pt concerned about being able to pay for medications, she states she will get paid on the 3rd. RN stressed the importance of getting prescriptions filled. All follow up appointments discussed with pt. RN instructed pt to call for follow up with primary care physician. Pt agreed. Cecille Rubinhompson,Eldredge Veldhuizen V, RN

## 2014-08-10 NOTE — Discharge Summary (Signed)
Physician Discharge Summary  Betty Miranda UJW:[id number] DOB: 1955-10-20 DOA: 08/08/2014  PCP: PROVIDER NOT IN SYSTEM  Admit date: 08/08/2014 Discharge date: 08/10/2014  PCP: Mortimer Fries  Time spent: 20 minutes  Recommendations for Outpatient Follow-up:  1. Follow up with PCP in 1-2 weeks 2. Would follow up with Cardiology as scheduled as an outpatient, recommendations for possible outpatient stress test  Discharge Diagnoses:  Principal Problem:   Chest pain Active Problems:   Abdominal pain, left lower quadrant   Hypokalemia   UTI (urinary tract infection)   Constipation   Elevated WBC count   Essential hypertension   Hyperlipidemia   Acute cystitis without hematuria   Kidney stone on left side   Discharge Condition: Stable  Diet recommendation: Heart healthy  Filed Weights   08/08/14 2136 08/10/14 0322  Weight: 123.832 kg (273 lb) 119.477 kg (263 lb 6.4 oz)    History of present illness:  Please see admit h and p from 4/27 for details. Briefly, pt presented with chest pain and L sided flank/abd pain. Pt was admitted for further work up.  Hospital Course:  1. Chest pain 1. Remained chest pain free 2. Pt seen by Cardiology with recs for 2d echo, found to have normal LVEF and no WMA 3. Cardiology recs for possible outpt stress test 4. Enzymes serially neg x 3 2. UTI 1. Clinically improving on empiric rocephin 2. Urine cx remains pending 3. Would transition to PO ceftin to complete course 3. L sided renal calculi with abd pain 1. 9mm stone on abd xray 2. Started flomax 3. Encourage adequate PO intake 4. Hypokalemia 1. Normalized 5. DVT prophylaxis 1. Lovenox while inpatient  Procedures:  2d echo  Consultations:  Cardiology  Discharge Exam: Filed Vitals:   08/10/14 0000 08/10/14 0100 08/10/14 0322 08/10/14 0500  BP:   110/76   Pulse: 63 65 73   Temp:    97.9 F (36.6 C)  TempSrc:    Oral  Resp: Height:    (1.549 m)    Weight:   119.477 kg (263 lb 6.4 oz)   SpO2: 95% 95% 93%     General: awake, in nad Cardiovascular: regular, s1, s2 Respiratory: normal resp effort, no wheezing  Discharge Instructions     Medication List    TAKE these medications        cefUROXime 250 MG tablet  Commonly known as:  CEFTIN  Take 1 tablet (250 mg total) by mouth 2 (two) times daily with a meal.     ibuprofen 200 MG tablet  Commonly known as:  ADVIL,MOTRIN  Take 200 mg by mouth every 6 (six) hours as needed for mild pain or moderate pain.     ketorolac 10 MG tablet  Commonly known as:  TORADOL  Take 1 tablet (10 mg total) by mouth every 6 (six) hours as needed.     lisinopril-hydrochlorothiazide 20-25 MG per tablet  Commonly known as:  PRINZIDE,ZESTORETIC  Take 1 tablet by mouth daily.     tamsulosin 0.4 MG Caps capsule  Commonly known as:  FLOMAX  Take 1 capsule (0.4 mg total) by mouth daily.      ASK your doctor about these medications        atenolol-chlorthalidone 50-25 MG per tablet  Commonly known as:  TENORETIC  Take 1 tablet by mouth daily.       Allergies  Allergen Reactions  . Sulfa Antibiotics     rash  Follow-up Information    Schedule an appointment as soon as possible for a visit with Follow up with your PCP in 1-2 weeks.   Why:  Hospital follow up       The results of significant diagnostics from this hospitalization (including imaging, microbiology, ancillary and laboratory) are listed below for reference.    Significant Diagnostic Studies: Dg Chest 2 View  08/09/2014   CLINICAL DATA:  59 year old female with left lower quadrant pain and nausea for the past 4 days  EXAM: CHEST  2 VIEW  COMPARISON:  None  FINDINGS: Cardiac and mediastinal contours are within normal limits. No focal airspace consolidation, pleural effusion, pulmonary edema or pneumothorax. No acute osseous abnormality. Unremarkable visualized upper abdominal bowel gas pattern. Mild central bronchitic change.   IMPRESSION: No active cardiopulmonary disease.   Electronically Signed   By: Malachy MoanHeath  McCullough M.D.   On: 08/09/2014 02:56   Dg Abd 2 Views  08/09/2014   CLINICAL DATA:  Chest pain and left lower quadrant abdominal pain. Present for 2 days.  EXAM: ABDOMEN - 2 VIEW  COMPARISON:  None  FINDINGS: No intraperitoneal free air is identified. Gas is present in nondilated loops of small and large bowel to the level of the rectum without evidence of obstruction. There is a small amount of colonic stool present, greatest proximally. A 9 mm oblong calcification projects in the left mid abdomen and may be renal in origin. No acute osseous abnormality is seen.  IMPRESSION: 1. Nonobstructed bowel-gas pattern. 2. Possible 9 mm left renal calculus.   Electronically Signed   By: Sebastian AcheAllen  Grady   On: 08/09/2014 08:56    Microbiology: Recent Results (from the past 240 hour(s))  Urine culture     Status: None   Collection Time: 08/08/14  9:37 PM  Result Value Ref Range Status   Specimen Description URINE, CLEAN CATCH  Final   Special Requests NONE  Final   Colony Count   Final    75,000 COLONIES/ML Performed at [facility]    Culture   Final    Multiple bacterial morphotypes present, none predominant. Suggest appropriate recollection if clinically indicated. Performed at [facility]    Report Status 08/10/2014 FINAL  Final     Labs: Basic Metabolic Panel:  Recent Labs Lab 08/09/14 0055 08/09/14 0810 08/10/14 0248  NA 139 141 139  K 3.3* 3.5 3.6  CL 105 104 103  CO2 27 27 28   GLUCOSE 117* 110* 118*  BUN 12 9 12   CREATININE 0.70 0.86 0.83  CALCIUM 8.8 8.7 8.5   Liver Function Tests:  Recent Labs Lab 08/09/14 0810  AST 17  ALT 13  ALKPHOS 86  BILITOT 0.8  PROT 6.5  ALBUMIN 3.1*   No results for input(s): LIPASE, AMYLASE in the last 168 hours. No results for input(s): AMMONIA in the last 168 hours. CBC:  Recent Labs Lab 08/09/14 0055 08/09/14 0810 08/10/14 0248   WBC 14.7* 11.4* 8.7  NEUTROABS 11.2* 8.2*  --   HGB 15.0 13.6 12.4  HCT 45.1 41.8 38.9  MCV 90.2 90.5 91.1  PLT 202 201 187   Cardiac Enzymes:  Recent Labs Lab 08/09/14 0055 08/09/14 0810 08/09/14 1244 08/09/14 1852  TROPONINI <0.03 <0.03 <0.03 <0.03   BNP: BNP (last 3 results)  Recent Labs  08/09/14 0055  BNP 35.0    ProBNP (last 3 results) No results for input(s): PROBNP in the last 8760 hours.  CBG: No results for input(s): GLUCAP  in the last 168 hours.   Signed:  CHIU, STEPHEN K  [facility] 08/10/2014, 11:28 AM

## 2014-08-10 NOTE — Progress Notes (Signed)
  Echocardiogram 2D Echocardiogram has been performed.  Betty Miranda FRANCES 08/10/2014, 9:09 AM

## 2014-08-10 NOTE — Progress Notes (Addendum)
    Subjective: No CP  Objective: Vital signs in last 24 hours: Temp:  [97.7 F (36.5 C)-97.9 F (36.6 C)] 97.9 F (36.6 C) (04/28 0500) Pulse Rate:  [60-77] 73 (04/28 0322) Resp:  [15-24] 15 (04/28 0322) BP: (110-119)/(55-76) 110/76 mmHg (04/28 0322) SpO2:  [89 %-97 %] 93 % (04/28 0322) Weight:  [263 lb 6.4 oz (119.477 kg)] 263 lb 6.4 oz (119.477 kg) (04/28 0322) Last BM Date: 08/08/14  Intake/Output from previous day: 04/27 0701 - 04/28 0700 In: 290 [P.O.:240; IV Piggyback:50] Out: 100 [Urine:100] Intake/Output this shift: Total I/O In: 240 [P.O.:240] Out: -   Medications Scheduled Meds: . aspirin EC  325 mg Oral Daily  . cefTRIAXone (ROCEPHIN)  IV  2 g Intravenous Q24H  . enoxaparin (LOVENOX) injection  40 mg Subcutaneous Q24H  . tamsulosin  0.4 mg Oral Daily   Continuous Infusions:  PRN Meds:.acetaminophen, ketorolac, magnesium hydroxide, morphine injection, ondansetron (ZOFRAN) IV  PE: General appearance: alert, cooperative and no distress Lungs: clear to auscultation bilaterally Heart: regular rate and rhythm, S1, S2 normal, no murmur, click, rub or gallop Extremities: No LEE Pulses: 2+ and symmetric Skin: Warm and dry Neurologic: Grossly normal  Lab Results:   Recent Labs  08/09/14 0055 08/09/14 0810 08/10/14 0248  WBC 14.7* 11.4* 8.7  HGB 15.0 13.6 12.4  HCT 45.1 41.8 38.9  PLT 202 201 187   BMET  Recent Labs  08/09/14 0055 08/09/14 0810 08/10/14 0248  NA 139 141 139  K 3.3* 3.5 3.6  CL 105 104 103  CO2 27 27 28   GLUCOSE 117* 110* 118*  BUN 12 9 12   CREATININE 0.70 0.86 0.83  CALCIUM 8.8 8.7 8.5   Cardiac Panel (last 3 results)  Recent Labs  08/09/14 0810 08/09/14 1244 08/09/14 1852  TROPONINI <0.03 <0.03 <0.03    Assessment/Plan  Principal Problem:   Chest pain Ruled out for MI.  Will review echo which was just completed.  It is likely she can go after that.  Consider OP stress.     Abdominal pain, left lower  quadrant   Hypokalemia  WNL   UTI (urinary tract infection)   Constipation   Elevated WBC count   Essential hypertension  Well controlled. No meds.    Hyperlipidemia   Acute cystitis without hematuria   Obesity   LOS: 1 day    HAGER, BRYAN PA-C 08/10/2014 8:40 AM  Personally seen and examined. Agree with above. Feels well now Morbid obesity - encourage wt loss FHX of CAD NSSTW changes Kidney stone pain resolved. ECHO reassuring Will set up for outpt NUC stress, lexi Follow up one week after that with Dr. Myrtis SerKatz (see his note) Will have Wilburt FinlayBryan Hager, PA assist with this.  She will have reduced sensitivity due to body habitus (2 day study)  OK with DC home.   Donato SchultzSKAINS, Sybella Harnish, MD

## 2014-08-21 ENCOUNTER — Encounter (HOSPITAL_COMMUNITY): Payer: Medicare HMO

## 2014-09-25 ENCOUNTER — Ambulatory Visit: Payer: Medicare HMO | Admitting: Cardiology

## 2017-03-01 ENCOUNTER — Emergency Department (HOSPITAL_BASED_OUTPATIENT_CLINIC_OR_DEPARTMENT_OTHER): Payer: Medicare Other

## 2017-03-01 ENCOUNTER — Emergency Department (HOSPITAL_BASED_OUTPATIENT_CLINIC_OR_DEPARTMENT_OTHER)
Admission: EM | Admit: 2017-03-01 | Discharge: 2017-03-01 | Disposition: A | Discharge status: Home / Self Care | Payer: Medicare Other | Attending: Emergency Medicine | Admitting: Emergency Medicine

## 2017-03-01 ENCOUNTER — Other Ambulatory Visit: Payer: Self-pay

## 2017-03-01 ENCOUNTER — Encounter (HOSPITAL_BASED_OUTPATIENT_CLINIC_OR_DEPARTMENT_OTHER): Payer: Self-pay | Admitting: *Deleted

## 2017-03-01 DIAGNOSIS — E039 Hypothyroidism, unspecified: Secondary | ICD-10-CM | POA: Diagnosis not present

## 2017-03-01 DIAGNOSIS — J45901 Unspecified asthma with (acute) exacerbation: Secondary | ICD-10-CM

## 2017-03-01 DIAGNOSIS — R0600 Dyspnea, unspecified: Secondary | ICD-10-CM | POA: Diagnosis not present

## 2017-03-01 DIAGNOSIS — I10 Essential (primary) hypertension: Secondary | ICD-10-CM | POA: Insufficient documentation

## 2017-03-01 DIAGNOSIS — Z79899 Other long term (current) drug therapy: Secondary | ICD-10-CM | POA: Insufficient documentation

## 2017-03-01 DIAGNOSIS — J40 Bronchitis, not specified as acute or chronic: Secondary | ICD-10-CM | POA: Insufficient documentation

## 2017-03-01 DIAGNOSIS — R05 Cough: Secondary | ICD-10-CM | POA: Diagnosis present

## 2017-03-01 MED ORDER — PREDNISONE 20 MG PO TABS: 20.0000 mg | ORAL_TABLET | Freq: Two times a day (BID) | ORAL | 0 refills | Status: DC

## 2017-03-01 MED ORDER — PREDNISONE 50 MG PO TABS
60.0000 mg | ORAL_TABLET | Freq: Once | ORAL | Status: AC
  Administered 2017-03-01: 60 mg via ORAL
  Filled 2017-03-01: qty 1

## 2017-03-01 MED ORDER — BENZONATATE 100 MG PO CAPS
200.0000 mg | ORAL_CAPSULE | Freq: Once | ORAL | Status: AC
  Administered 2017-03-01: 200 mg via ORAL
  Filled 2017-03-01: qty 2

## 2017-03-01 MED ORDER — ALBUTEROL SULFATE HFA 108 (90 BASE) MCG/ACT IN AERS: 1.0000 | INHALATION_SPRAY | Freq: Four times a day (QID) | RESPIRATORY_TRACT | 0 refills | Status: AC | PRN

## 2017-03-01 MED ORDER — AZITHROMYCIN 250 MG PO TABS
500.0000 mg | ORAL_TABLET | Freq: Once | ORAL | Status: AC
  Administered 2017-03-01: 500 mg via ORAL
  Filled 2017-03-01: qty 2

## 2017-03-01 MED ORDER — ALBUTEROL SULFATE (2.5 MG/3ML) 0.083% IN NEBU
2.5000 mg | INHALATION_SOLUTION | Freq: Once | RESPIRATORY_TRACT | Status: AC
  Administered 2017-03-01: 2.5 mg via RESPIRATORY_TRACT
  Filled 2017-03-01: qty 3

## 2017-03-01 MED ORDER — BENZONATATE 100 MG PO CAPS: 100.0000 mg | ORAL_CAPSULE | Freq: Three times a day (TID) | ORAL | 0 refills | Status: DC

## 2017-03-01 MED ORDER — AZITHROMYCIN 250 MG PO TABS: 250.0000 mg | ORAL_TABLET | Freq: Every day | ORAL | 0 refills | Status: DC

## 2017-03-01 MED ORDER — ALBUTEROL SULFATE HFA 108 (90 BASE) MCG/ACT IN AERS
2.0000 | INHALATION_SPRAY | RESPIRATORY_TRACT | Status: DC
  Administered 2017-03-01: 2 via RESPIRATORY_TRACT
  Filled 2017-03-01: qty 6.7

## 2017-03-01 NOTE — Discharge Instructions (Signed)
We have asked the case manager [facility] to investigate if you might qualify for assistance the one of our free clinics at the [facility].  You should receive a phone call tomorrow from case manager.

## 2017-03-01 NOTE — ED Notes (Signed)
Patient's phone number is [phone].  Patient stated that [facility] will [facility].  Patient was informed that Case Manager will be calling her in AM.

## 2017-03-01 NOTE — ED Provider Notes (Signed)
[facility] EMERGENCY DEPARTMENT Provider Note   CSN: [id number] Arrival date & time: 03/01/17  1718     History   Chief Complaint Chief Complaint  Patient presents with  . Cough  . Abdominal Pain    HPI Betty Miranda is a 61 y.o. female. Chief complaint is cough and difficulty breathing  HPI:  This is a 61 year old female presents with 3 weeks of cough. States she feels tight and wheezing when she is. Occasional clear sputum. No hemoptysis or discolored sputum. No chest pain. Chronic peripheral edema without change. No history of cardiac disease. History of CHF. No history of a risk factors for DVT PE.  Patient is homeless. States that she has exhausted all of her resources with family and friends. Plans on sleeping in her car tonight and states she may have some additional opportunities with friends tomorrow.  Admits that she is depressed. States very clearly that she does not contemplate suicide or self-harm but is depressed about her situation. I offered for her to Dr. 2 behavioral health staff, and she politely declines.  Past Medical History:  Diagnosis Date  . Anginal pain (HCC)   . Arthritis   . Dyslipidemia   . Headache   . Hypertension   . Hypothyroidism   . Pneumonia   . Shortness of breath dyspnea     Patient Active Problem List   Diagnosis Date Noted  . Kidney stone on left side 08/10/2014  . Chest pain 08/09/2014  . Abdominal pain, left lower quadrant 08/09/2014  . Hypokalemia 08/09/2014  . UTI (urinary tract infection) 08/09/2014  . Constipation 08/09/2014  . Elevated WBC count 08/09/2014  . Essential hypertension 08/09/2014  . Hyperlipidemia 08/09/2014  . Acute cystitis without hematuria     Past Surgical History:  Procedure Laterality Date  . CARPAL TUNNEL RELEASE Bilateral   . FOOT ARTHRODESIS, MODIFIED MCBRIDE Left   . FOOT SURGERY Right    Toe bone removal and spur removals  . JOINT REPLACEMENT    . KNEE SURGERY    . ROTATOR  CUFF REPAIR Left   . SHOULDER SURGERY Right    to remove bone spur    OB History    No data available       Home Medications    Prior to Admission medications   Medication Sig Start Date End Date Taking? Authorizing Provider  albuterol (PROVENTIL HFA;VENTOLIN HFA) 108 (90 Base) MCG/ACT inhaler Inhale 1-2 puffs every 6 (six) hours as needed into the lungs for wheezing. 03/01/17   Rolland PorterJames, Mylz Yuan, MD  azithromycin (ZITHROMAX Z-PAK) 250 MG tablet Take 1 tablet (250 mg total) daily by mouth. As directed 03/01/17   Rolland PorterJames, Pearce Littlefield, MD  benzonatate (TESSALON) 100 MG capsule Take 1 capsule (100 mg total) every 8 (eight) hours by mouth. 03/01/17   Rolland PorterJames, Lateefa Crosby, MD  cefUROXime (CEFTIN) 250 MG tablet Take 1 tablet (250 mg total) by mouth 2 (two) times daily with a meal. 08/10/14   Jerald Kiefhiu, Stephen K, MD  ibuprofen (ADVIL,MOTRIN) 200 MG tablet Take 200 mg by mouth every 6 (six) hours as needed for mild pain or moderate pain.    [provider]  ketorolac (TORADOL) 10 MG tablet Take 1 tablet (10 mg total) by mouth every 6 (six) hours as needed. 08/10/14   Jerald Kiefhiu, Stephen K, MD  lisinopril-hydrochlorothiazide (PRINZIDE,ZESTORETIC) 20-25 MG per tablet Take 1 tablet by mouth daily. 08/10/14   Jerald Kiefhiu, Stephen K, MD  predniSONE (DELTASONE) 20 MG tablet Take 1 tablet (20  mg total) 2 (two) times daily with a meal by mouth. 03/01/17   Rolland PorterJames, Charm Stenner, MD  tamsulosin (FLOMAX) 0.4 MG CAPS capsule Take 1 capsule (0.4 mg total) by mouth daily. 08/10/14   Jerald Kiefhiu, Stephen K, MD    Family History Family History  Problem Relation Age of Onset  . Stroke Mother   . Heart attack Father 40       died at 1667  . Stroke Sister     Social History Social History   Tobacco Use  . Smoking status: Never Smoker  . Smokeless tobacco: Never Used  Substance Use Topics  . Alcohol use: No  . Drug use: No     Allergies   Sulfa antibiotics   Review of Systems Review of Systems  Constitutional: Negative for appetite change,  chills, diaphoresis, fatigue and fever.  HENT: Positive for congestion. Negative for mouth sores, sore throat and trouble swallowing.   Eyes: Negative for visual disturbance.  Respiratory: Positive for cough and shortness of breath. Negative for chest tightness and wheezing.   Cardiovascular: Negative for chest pain.  Gastrointestinal: Negative for abdominal distention, abdominal pain, diarrhea, nausea and vomiting.  Endocrine: Negative for polydipsia, polyphagia and polyuria.  Genitourinary: Negative for dysuria, frequency and hematuria.  Musculoskeletal: Negative for gait problem.  Skin: Negative for color change, pallor and rash.  Neurological: Negative for dizziness, syncope, light-headedness and headaches.  Hematological: Does not bruise/bleed easily.  Psychiatric/Behavioral: Negative for behavioral problems and confusion.     Physical Exam Updated Vital Signs BP (!) 149/65 (BP Location: Right Wrist)   Pulse 90   Temp 98.2 F (36.8 C)   Resp 20   Ht 5\' 1"  (1.549 m)   Wt 131.5 kg (290 lb)   SpO2 93%   BMI 54.80 kg/m   Physical Exam  Constitutional: She is oriented to person, place, and time. She appears well-developed and well-nourished. No distress.  HENT:  Head: Normocephalic.  Eyes: Conjunctivae are normal. Pupils are equal, round, and reactive to light. No scleral icterus.  Neck: Normal range of motion. Neck supple. No thyromegaly present.  Cardiovascular: Normal rate and regular rhythm. Exam reveals no gallop and no friction rub.  No murmur heard. Pulmonary/Chest: No respiratory distress. She has wheezes. She has no rales.  Abdominal: Soft. Bowel sounds are normal. She exhibits no distension. There is no tenderness. There is no rebound.  Musculoskeletal: Normal range of motion.  Neurological: She is alert and oriented to person, place, and time.  Skin: Skin is warm and dry. No rash noted.  Psychiatric: She has a normal mood and affect. Her behavior is normal.      ED Treatments / Results  Labs (all labs ordered are listed, but only abnormal results are displayed) Labs Reviewed - No data to display  EKG  EKG Interpretation None       Radiology Dg Chest 2 View  Result Date: 03/01/2017 CLINICAL DATA:  Cough for 3 weeks EXAM: CHEST  2 VIEW COMPARISON:  08/09/2014 FINDINGS: The heart size and mediastinal contours are within normal limits. Both lungs are clear. The visualized skeletal structures are unremarkable. IMPRESSION: No active cardiopulmonary disease. Electronically Signed   By: Alcide CleverMark  Lukens M.D.   On: 03/01/2017 20:06    Procedures Procedures (including critical care time)  Medications Ordered in ED Medications  albuterol (PROVENTIL HFA;VENTOLIN HFA) 108 (90 Base) MCG/ACT inhaler 2 puff (2 puffs Inhalation Given 03/01/17 1926)  azithromycin (ZITHROMAX) tablet 500 mg (not administered)  albuterol (PROVENTIL) (2.5  MG/3ML) 0.083% nebulizer solution 2.5 mg (2.5 mg Nebulization Given 03/01/17 1912)  predniSONE (DELTASONE) tablet 60 mg (60 mg Oral Given 03/01/17 1922)  benzonatate (TESSALON) capsule 200 mg (200 mg Oral Given 03/01/17 1922)     Initial Impression / Assessment and Plan / ED Course  I have reviewed the triage vital signs and the nursing notes.  Pertinent labs & imaging results that were available during my care of the patient were reviewed by me and considered in my medical decision making (see chart for details).    Given immunized albuterol. Instructed to use an albuterol inhaler and spacer. Given by mouth prednisone. Chest x-ray shows no failure, effusions, cardiac megaly, or infiltrates. I asked her care manager to contact her if she might qualify for [facility] card or [facility] assistance. Given by mouth prednisone and Zithromax here. Dispense with inhaler. Given prescriptions for prednisone, Zithromax, Tessalon, and albuterol. Return if worsening.  Final Clinical Impressions(s) / ED Diagnoses   Final  diagnoses:  Bronchitis  Moderate asthma with acute exacerbation, unspecified whether persistent    ED Discharge Orders        Ordered    albuterol (PROVENTIL HFA;VENTOLIN HFA) 108 (90 Base) MCG/ACT inhaler  Every 6 hours PRN     03/01/17 2101    predniSONE (DELTASONE) 20 MG tablet  2 times daily with meals     03/01/17 2101    benzonatate (TESSALON) 100 MG capsule  Every 8 hours     03/01/17 2101    azithromycin (ZITHROMAX Z-PAK) 250 MG tablet  Daily     03/01/17 2101       Rolland Porter, MD 03/01/17 2105

## 2017-03-01 NOTE — ED Triage Notes (Signed)
Pt presents with RLQ pain x3days. Denies fever, n/v/d. Also reports productive cough x3wks with sob on exertion. Pt also homeless and is expressing hopelessness and thoughts of harming herself. States she doesn't have a plan and doesn't intend to harm self, but states, "I wish I weren't here."

## 2017-03-01 NOTE — ED Notes (Signed)
Patient is pleasantly conversing watching TV.

## 2017-03-02 ENCOUNTER — Telehealth: Payer: Self-pay

## 2017-03-02 ENCOUNTER — Telehealth: Payer: Self-pay | Admitting: Emergency Medicine

## 2017-03-02 MED FILL — ?PREDNISONE 20 MG TABLET: 20 | 5 days supply | Qty: 10 | Fill #0

## 2017-03-02 MED FILL — BENZONATATE 100 MG CAPSULE: 100 | 7 days supply | Qty: 21 | Fill #0

## 2017-03-02 MED FILL — AZITHROMYCIN 250 MG TABLET: 250 | 5 days supply | Qty: 6 | Fill #0

## 2017-03-02 MED FILL — !VENTOLIN HFA INHALER: 108 (90 BAS | 18 days supply | Qty: 18 | Fill #0

## 2017-03-02 NOTE — Telephone Encounter (Signed)
Call received from Haze Justin, RN CM noting that the patient is on her way to [facility] pharmacy to pick up medications and she needs a hospital follow up appointment.    Met with the patient when she arrived at [facility]. Scheduled her a hospital follow up appointment for 03/10/17 @ 1000.   She explained that she is currently living in her car but has a friend that is working on trying to secure housing for her.  Discussed the option of shelter but she said that she does not want to go to a shelter. She would prefer to wait and see what her friend is able find for her.  She said that she receives her disability check on the 3rd of the month and is currently out of money this month. Provided her with contact information for the [place].    She currently has insurance with [employer] so she is not eligible for the [employer].  Informed her about the services offered at [facility] including , pharmacy assistance, social work and financial counseling.  She was very appreciative of the assistance.   Update provided to Jillyn Ledger, RN CM

## 2017-03-02 NOTE — Telephone Encounter (Signed)
CM consulted for medication assistance.  CM spoke with pt who states [facility] as we were on the phone.  Advised her to attempt to make an appointment for PCP establishment and financial counseling while there.  Pt acknowledged understanding.  Will consult with [facility] CM Erskine SquibbJane to make aware of pt.  No further CM needs noted at this time.

## 2017-03-10 ENCOUNTER — Other Ambulatory Visit: Payer: Self-pay

## 2017-03-10 ENCOUNTER — Ambulatory Visit: Payer: Medicare Other | Attending: Internal Medicine | Admitting: Physician Assistant

## 2017-03-10 VITALS — BP 170/84 | HR 86 | Temp 98.3°F | Resp 20 | Wt 306.2 lb

## 2017-03-10 DIAGNOSIS — Z791 Long term (current) use of non-steroidal anti-inflammatories (NSAID): Secondary | ICD-10-CM | POA: Diagnosis not present

## 2017-03-10 DIAGNOSIS — Z882 Allergy status to sulfonamides status: Secondary | ICD-10-CM | POA: Insufficient documentation

## 2017-03-10 DIAGNOSIS — E039 Hypothyroidism, unspecified: Secondary | ICD-10-CM | POA: Diagnosis not present

## 2017-03-10 DIAGNOSIS — Z823 Family history of stroke: Secondary | ICD-10-CM | POA: Diagnosis not present

## 2017-03-10 DIAGNOSIS — Z8249 Family history of ischemic heart disease and other diseases of the circulatory system: Secondary | ICD-10-CM | POA: Diagnosis not present

## 2017-03-10 DIAGNOSIS — I10 Essential (primary) hypertension: Secondary | ICD-10-CM | POA: Diagnosis present

## 2017-03-10 DIAGNOSIS — E785 Hyperlipidemia, unspecified: Secondary | ICD-10-CM | POA: Diagnosis not present

## 2017-03-10 DIAGNOSIS — Z79899 Other long term (current) drug therapy: Secondary | ICD-10-CM | POA: Insufficient documentation

## 2017-03-10 DIAGNOSIS — Z590 Homelessness: Secondary | ICD-10-CM | POA: Insufficient documentation

## 2017-03-10 DIAGNOSIS — Z9889 Other specified postprocedural states: Secondary | ICD-10-CM | POA: Diagnosis not present

## 2017-03-10 MED ORDER — LISINOPRIL-HYDROCHLOROTHIAZIDE 10-12.5 MG PO TABS: 1.0000 | ORAL_TABLET | Freq: Every day | ORAL | 3 refills | Status: DC

## 2017-03-10 NOTE — Progress Notes (Signed)
Betty NovemberSandra Miranda  WUJ:[id number]  NFA:[id number]  DOB - 30-Mar-1956  Chief Complaint  Patient presents with  . Follow-up    ED       Subjective:   Betty NovemberSandra Miranda is a 61 y.o. female here today for establishment of care. She has a history of hypertension only. She's not been seen by primary care provider in 2-3 years. She has fallen on some hard times. Most recently she has been homeless and living in her car. This week she has been [place].   She was in the emergency department on 03/01/2017 with increasing cough and dyspnea for 2-3 weeks. She had some wheezing. She was coughing so much that she was becoming sore on her right side and thought that she had broken something. Her chest x-ray was favorable. No labs were drawn. She was treated with inhalers, nebulizations, steroids and Tessalon Perles. At discharge they added an antibiotic. She was able to get the prescriptions field. She has not had any recurrence of symptoms. She feels a lot better. Still little bit of phlegm lingering but otherwise doing okay. No chest pain. No shortness of breath. No fevers. No new complaints.   ROS: GEN: denies fever or chills, denies change in weight Skin: denies lesions or rashes HEENT: denies headache, earache, epistaxis, sore throat, or neck pain LUNGS: denies SHOB, dyspnea, PND, orthopnea CV: denies CP or palpitations ABD: denies abd pain, N or V EXT: denies muscle spasms or swelling; no pain in lower ext, no weakness NEURO: denies numbness or tingling, denies sz, stroke or TIA   ALLERGIES: Allergies  Allergen Reactions  . Sulfa Antibiotics     rash    PAST MEDICAL HISTORY: Past Medical History:  Diagnosis Date  . Anginal pain (HCC)   . Arthritis   . Dyslipidemia   . Headache   . Hypertension   . Hypothyroidism   . Pneumonia   . Shortness of breath dyspnea     PAST SURGICAL HISTORY: Past Surgical History:  Procedure Laterality Date  . CARPAL  TUNNEL RELEASE Bilateral   . FOOT ARTHRODESIS, MODIFIED MCBRIDE Left   . FOOT SURGERY Right    Toe bone removal and spur removals  . JOINT REPLACEMENT    . KNEE SURGERY    . ROTATOR CUFF REPAIR Left   . SHOULDER SURGERY Right    to remove bone spur    MEDICATIONS AT HOME: Prior to Admission medications   Medication Sig Start Date End Date Taking? Authorizing Provider  azithromycin (ZITHROMAX Z-PAK) 250 MG tablet Take 1 tablet (250 mg total) daily by mouth. As directed 03/01/17  Yes Rolland PorterJames, Mark, MD  benzonatate (TESSALON) 100 MG capsule Take 1 capsule (100 mg total) every 8 (eight) hours by mouth. 03/01/17  Yes Rolland PorterJames, Mark, MD  cefUROXime (CEFTIN) 250 MG tablet Take 1 tablet (250 mg total) by mouth 2 (two) times daily with a meal. 08/10/14  Yes Jerald Kiefhiu, Stephen K, MD  ibuprofen (ADVIL,MOTRIN) 200 MG tablet Take 200 mg by mouth every 6 (six) hours as needed for mild pain or moderate pain.   Yes [provider]  albuterol (PROVENTIL HFA;VENTOLIN HFA) 108 (90 Base) MCG/ACT inhaler Inhale 1-2 puffs every 6 (six) hours as needed into the lungs for wheezing. Patient not taking: Reported on 03/10/2017 03/01/17   Rolland PorterJames, Mark, MD  ketorolac (TORADOL) 10 MG tablet Take 1 tablet (10 mg total) by mouth every 6 (six) hours as needed. Patient not taking: Reported  on 03/10/2017 08/10/14   Jerald Kiefhiu, Stephen K, MD  lisinopril-hydrochlorothiazide (PRINZIDE,ZESTORETIC) 10-12.5 MG tablet Take 1 tablet by mouth daily. 03/10/17   Vivianne MasterNoel, Tiffany S, PA-C  predniSONE (DELTASONE) 20 MG tablet Take 1 tablet (20 mg total) 2 (two) times daily with a meal by mouth. Patient not taking: Reported on 03/10/2017 03/01/17   Rolland PorterJames, Mark, MD  tamsulosin (FLOMAX) 0.4 MG CAPS capsule Take 1 capsule (0.4 mg total) by mouth daily. Patient not taking: Reported on 03/10/2017 08/10/14   Jerald Kiefhiu, Stephen K, MD    Family History  Problem Relation Age of Onset  . Stroke Mother   . Heart attack Father 40       died at 6867  . Stroke  Sister     Social-homeless, single, nonsmoker  Objective:   Vitals:   03/10/17 0948 03/10/17 0954  BP: (!) 166/103 (!) 170/84  Pulse: 86   Resp: 20   Temp: 98.3 F (36.8 C)   TempSrc: Oral   SpO2: 97%   Weight: (!) 306 lb 3.2 oz (138.9 kg)     Exam General appearance : Awake, alert, not in any distress. Speech Clear. Not toxic looking HEENT: Atraumatic and Normocephalic, pupils equally reactive to light and accomodation Neck: supple, no JVD. No cervical lymphadenopathy.  Chest:Good air entry bilaterally, no added sounds  CVS: S1 S2 regular, no murmurs.  Extremities: B/L Lower Ext shows no edema, both legs are warm to touch Neurology: Awake alert, and oriented X 3, CN II-XII intact, Non focal   Assessment & Plan  1. Acute Bronchitis-resolved  -has a prn inhaler  2. HTN  -Lisinopril/HCTZ 10/12.5 mg daily   -BMP in 7-10 days  -RN visit for BP check in 7-10 days   Return in about 1 week (around 03/17/2017).  The patient was given clear instructions to go to ER or return to medical center if symptoms don't improve, worsen or new problems develop. The patient verbalized understanding. The patient was told to call to get lab results if they haven't heard anything in the next week.   Total time spent with patient was 31 min. Greater than 50 % of this visit was spent face to face counseling and coordinating care regarding risk factor modification, compliance importance and encouragement, education related to high blood pressure.  This note has been created with Education officer, environmentalDragon speech recognition software and smart phrase technology. Any transcriptional errors are unintentional.    Scot Juniffany Noel, PA-C [facility] [place], [place] [phone]   03/10/2017, 10:28 AM

## 2017-03-10 NOTE — Patient Instructions (Signed)
Low salt diet

## 2017-03-10 NOTE — Progress Notes (Signed)
F/u ED visit

## 2017-03-20 ENCOUNTER — Ambulatory Visit: Payer: Medicare Other | Admitting: Nurse Practitioner

## 2017-04-01 ENCOUNTER — Telehealth: Payer: Self-pay

## 2017-04-01 MED FILL — LISINOPRIL-HCTZ 10-12.5 MG: 10-12.5 | 30 days supply | Qty: 30 | Fill #0

## 2017-04-01 NOTE — Telephone Encounter (Signed)
Call received from the patient inquiring if her BP medication is ready for pick up at the pharmacy. She was not able to pick it up at the beginning of the month,.  As per Seward GraterMaggie, [facility], the medication is not ready yet but will be ready for pick up tomorrow. The patient was informed of this

## 2017-04-01 NOTE — Telephone Encounter (Signed)
Opened in error. Please disregard.

## 2017-05-27 ENCOUNTER — Telehealth: Payer: Self-pay

## 2017-05-27 NOTE — Telephone Encounter (Signed)
Late entry: 05/25/17 @ 1356:  this CM received a voicemail message from a patient named Betty Miranda with the phone # [phone], no birth date to confirm identity, so the call was not entered in Epic  at that time . This CM wanted to confirm this was the correct patient prior to entering the call. She said it was about BP pills. She requested a call back. This CM returned call and voicemail message was left requesting a call back. The phone # left by the patient matches the phone # for this Betty Miranda.    05/27/17 - no call back received yet. This CM placed another call to # [phone] and left a voicemail message requesting a call back to # [phone].

## 2017-09-10 DIAGNOSIS — N950 Postmenopausal bleeding: Secondary | ICD-10-CM | POA: Insufficient documentation

## 2017-09-21 DIAGNOSIS — N8502 Endometrial intraepithelial neoplasia [EIN]: Secondary | ICD-10-CM | POA: Insufficient documentation

## 2018-02-10 DIAGNOSIS — R079 Chest pain, unspecified: Secondary | ICD-10-CM | POA: Diagnosis not present

## 2018-02-11 DIAGNOSIS — R079 Chest pain, unspecified: Secondary | ICD-10-CM | POA: Diagnosis not present

## 2019-04-05 DIAGNOSIS — Z01818 Encounter for other preprocedural examination: Secondary | ICD-10-CM

## 2019-05-09 DIAGNOSIS — M1611 Unilateral primary osteoarthritis, right hip: Secondary | ICD-10-CM | POA: Insufficient documentation

## 2019-08-09 DIAGNOSIS — M216X1 Other acquired deformities of right foot: Secondary | ICD-10-CM | POA: Insufficient documentation

## 2019-08-09 DIAGNOSIS — L851 Acquired keratosis [keratoderma] palmaris et plantaris: Secondary | ICD-10-CM | POA: Insufficient documentation

## 2019-10-25 DIAGNOSIS — Z96641 Presence of right artificial hip joint: Secondary | ICD-10-CM | POA: Diagnosis not present

## 2019-10-25 DIAGNOSIS — Z471 Aftercare following joint replacement surgery: Secondary | ICD-10-CM | POA: Diagnosis not present

## 2020-05-08 DIAGNOSIS — Z471 Aftercare following joint replacement surgery: Secondary | ICD-10-CM | POA: Diagnosis not present

## 2020-05-08 DIAGNOSIS — Z96641 Presence of right artificial hip joint: Secondary | ICD-10-CM | POA: Diagnosis not present

## 2020-06-14 ENCOUNTER — Other Ambulatory Visit: Payer: Self-pay | Admitting: Nurse Practitioner

## 2020-06-14 DIAGNOSIS — N6311 Unspecified lump in the right breast, upper outer quadrant: Secondary | ICD-10-CM | POA: Diagnosis not present

## 2020-06-14 DIAGNOSIS — R922 Inconclusive mammogram: Secondary | ICD-10-CM | POA: Diagnosis not present

## 2020-06-14 DIAGNOSIS — R928 Other abnormal and inconclusive findings on diagnostic imaging of breast: Secondary | ICD-10-CM

## 2020-06-14 DIAGNOSIS — Z03818 Encounter for observation for suspected exposure to other biological agents ruled out: Secondary | ICD-10-CM | POA: Diagnosis not present

## 2020-06-20 ENCOUNTER — Ambulatory Visit
Admission: RE | Admit: 2020-06-20 | Discharge: 2020-06-20 | Disposition: A | Discharge status: Home / Self Care | Payer: Medicare HMO | Source: Ambulatory Visit | Attending: Nurse Practitioner | Admitting: Nurse Practitioner

## 2020-06-20 ENCOUNTER — Ambulatory Visit
Admission: RE | Admit: 2020-06-20 | Discharge: 2020-06-20 | Disposition: A | Discharge status: Home / Self Care | Payer: Medicare Other | Source: Ambulatory Visit | Attending: Nurse Practitioner | Admitting: Nurse Practitioner

## 2020-06-20 ENCOUNTER — Other Ambulatory Visit: Payer: Self-pay

## 2020-06-20 DIAGNOSIS — N641 Fat necrosis of breast: Secondary | ICD-10-CM | POA: Diagnosis not present

## 2020-06-20 DIAGNOSIS — R928 Other abnormal and inconclusive findings on diagnostic imaging of breast: Secondary | ICD-10-CM

## 2020-06-25 DIAGNOSIS — R1310 Dysphagia, unspecified: Secondary | ICD-10-CM | POA: Diagnosis not present

## 2020-06-25 DIAGNOSIS — K5900 Constipation, unspecified: Secondary | ICD-10-CM | POA: Diagnosis not present

## 2020-06-25 DIAGNOSIS — Z01818 Encounter for other preprocedural examination: Secondary | ICD-10-CM | POA: Diagnosis not present

## 2020-07-13 DIAGNOSIS — Z09 Encounter for follow-up examination after completed treatment for conditions other than malignant neoplasm: Secondary | ICD-10-CM | POA: Diagnosis not present

## 2020-07-13 DIAGNOSIS — Z86010 Personal history of colonic polyps: Secondary | ICD-10-CM | POA: Diagnosis not present

## 2020-07-13 DIAGNOSIS — K5730 Diverticulosis of large intestine without perforation or abscess without bleeding: Secondary | ICD-10-CM | POA: Diagnosis not present

## 2020-07-13 DIAGNOSIS — K648 Other hemorrhoids: Secondary | ICD-10-CM | POA: Diagnosis not present

## 2020-07-13 DIAGNOSIS — K644 Residual hemorrhoidal skin tags: Secondary | ICD-10-CM | POA: Diagnosis not present

## 2020-07-13 DIAGNOSIS — E669 Obesity, unspecified: Secondary | ICD-10-CM | POA: Diagnosis not present

## 2021-09-15 ENCOUNTER — Encounter: Payer: Self-pay | Admitting: Cardiology

## 2021-09-15 DIAGNOSIS — R0602 Shortness of breath: Secondary | ICD-10-CM | POA: Diagnosis not present

## 2021-10-01 IMAGING — MG MM BREAST BX W/ LOC DEV 1ST LESION IMAGE BX SPEC STEREO GUIDE*R*
8 of 13 series · 8 of 21 positions shown · non-contrast
Comparison: Previous exams.
COMPARISON: Previous exams.

Addendum:
CLINICAL DATA: 65-year-old female presenting for biopsy of a right
breast asymmetry.

EXAM:
RIGHT BREAST STEREOTACTIC CORE NEEDLE BIOPSY

[R (1 of 8)]
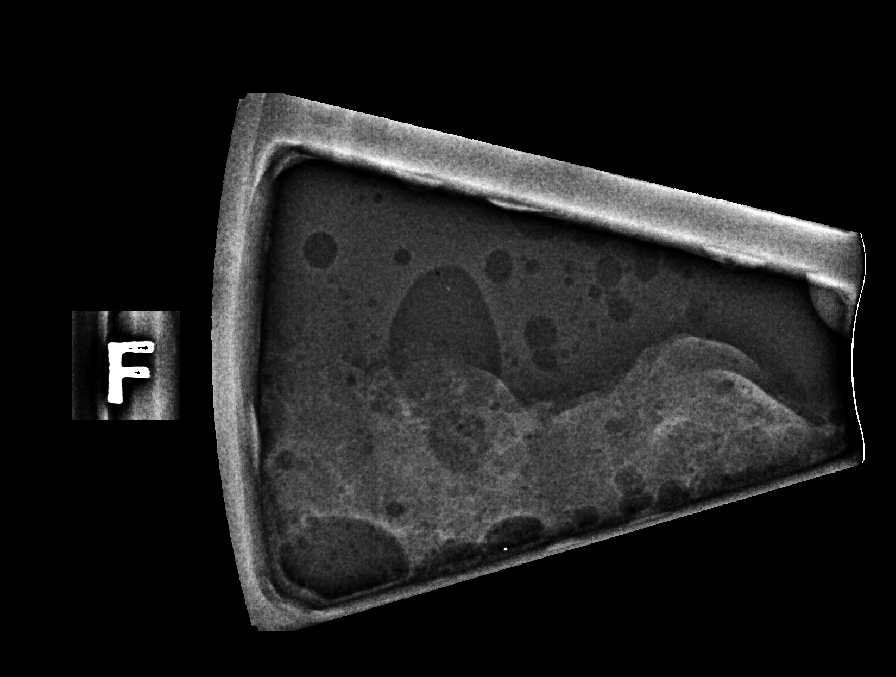

[R (2 of 8)]
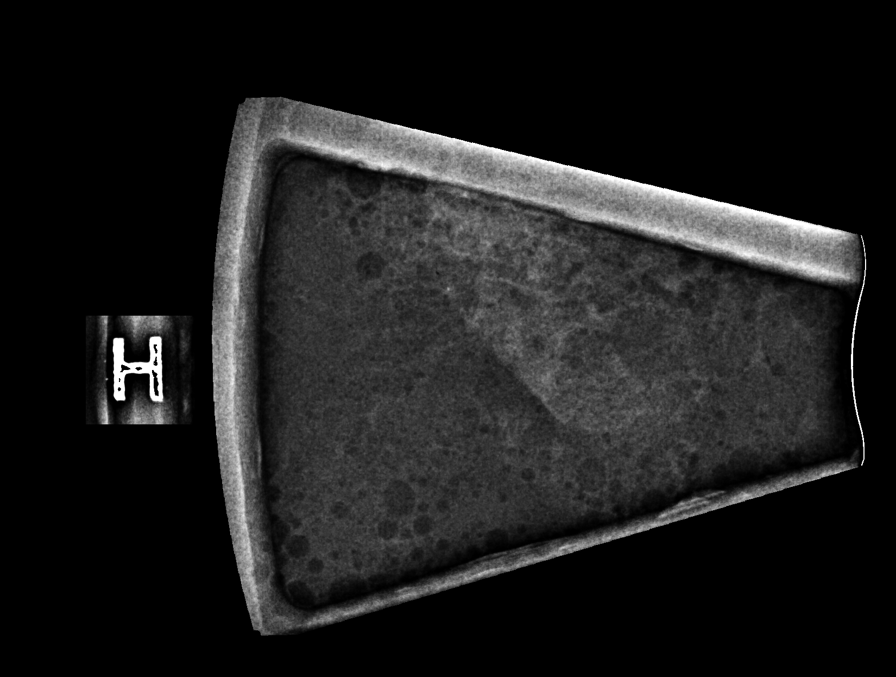

[R (3 of 8)]
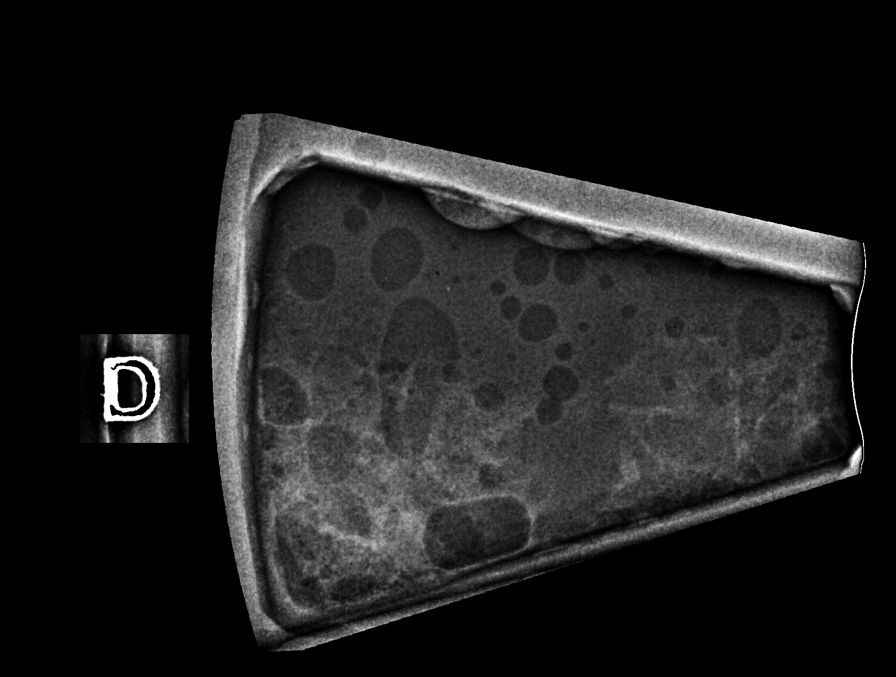

[R (4 of 8)]
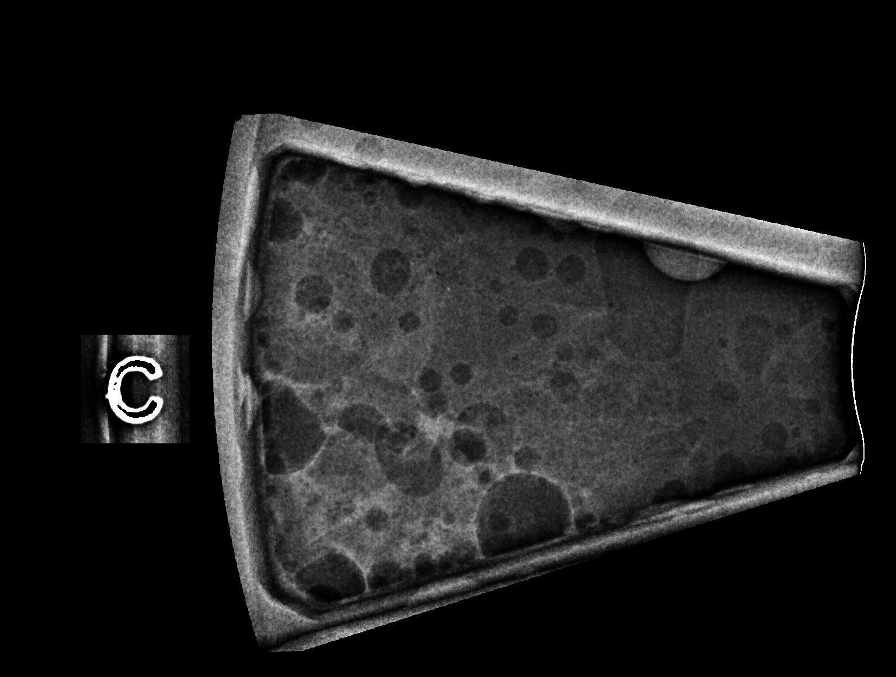

[R (5 of 8)]
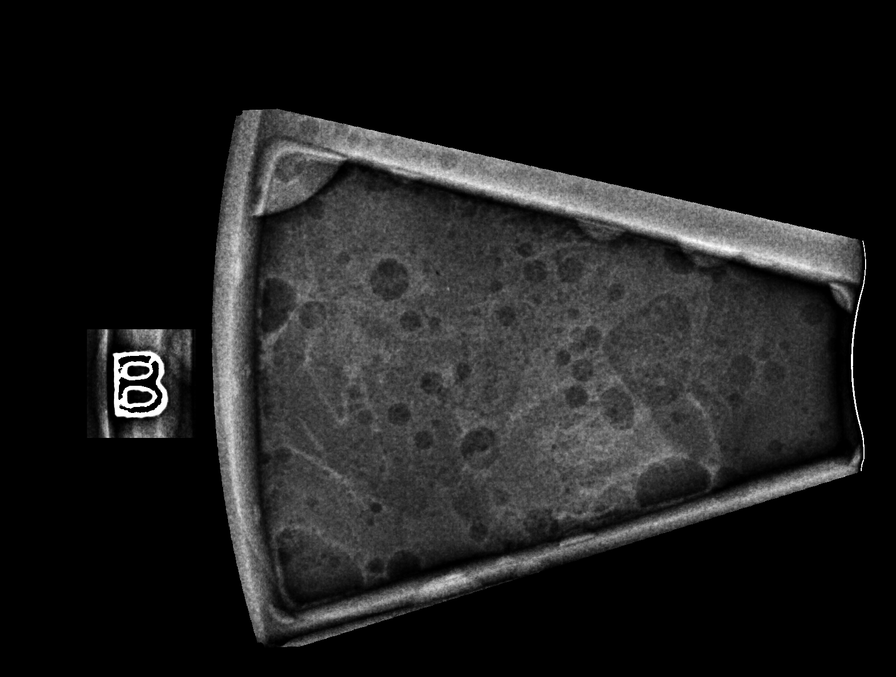

[R (6 of 8)]
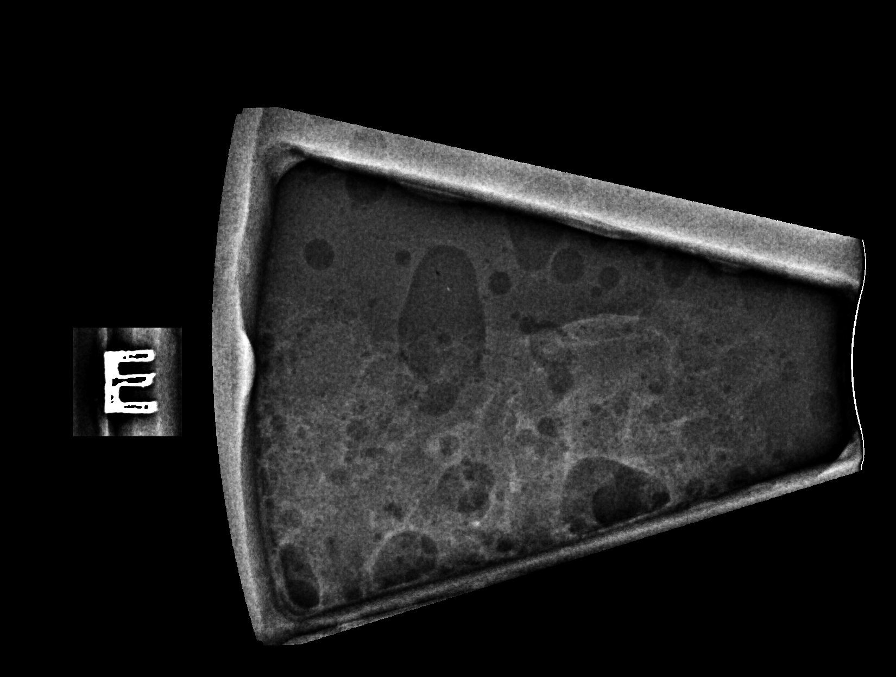

[R (7 of 8)]
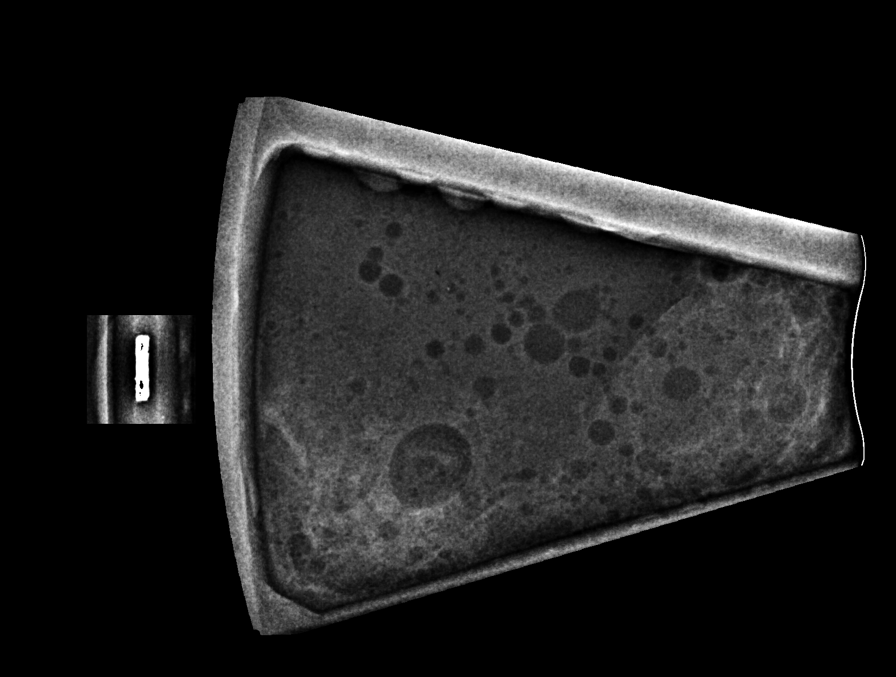

[R (8 of 8)]
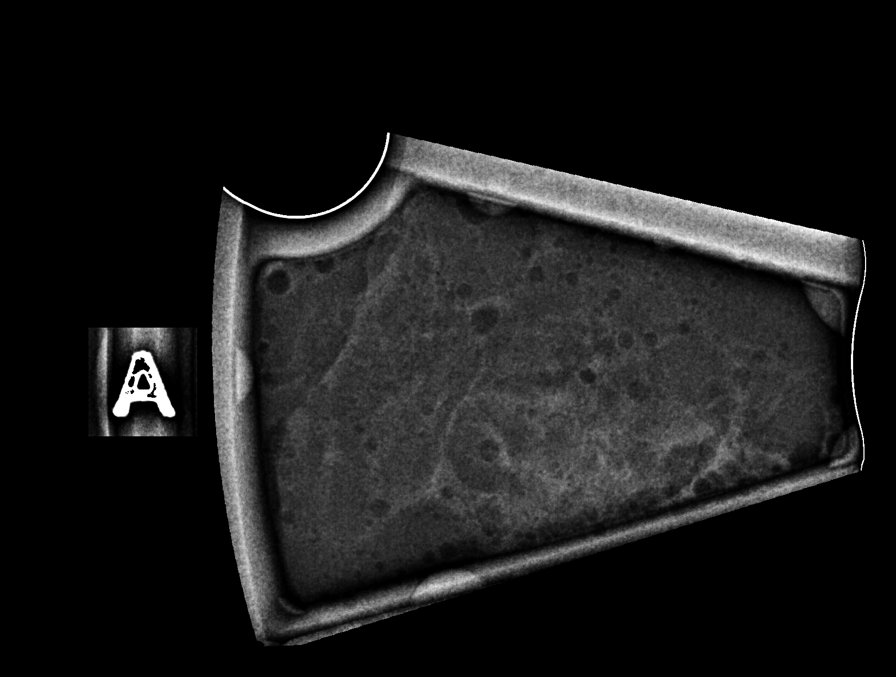

[8 of 21 positions shown; findings below may reference images not displayed]



Using sterile technique and 1% Lidocaine as local anesthetic, under
stereotactic guidance, a 9 gauge vacuum assisted device was used to
perform core needle biopsy of an asymmetry in the upper outer right
breast using a superior approach.

Lesion quadrant: Upper outer quadrant

At the conclusion of the procedure, coil tissue marker clip was
deployed into the biopsy cavity. Follow-up 2-view mammogram was
performed and dictated separately.
IMPRESSION: Stereotactic-guided biopsy of an asymmetry in the upper outer right
breast. No apparent complications.

ADDENDUM:
Pathology revealed FAT NECROSIS WITH ASSOCIATED INFLAMMATION AND
FIBROSIS of the RIGHT breast, upper outer. This was found to be
concordant by Dr. Nadege Gerber.

Pathology results were discussed with the patient by telephone. The
patient reported doing well after the biopsy with tenderness at the
site. Post biopsy instructions and care were reviewed and questions
were answered. The patient was encouraged to call The [REDACTED]

The patient was instructed to return for annual screening
mammography and informed a reminder notice would be sent regarding
this appointment.

Pathology results reported by Om Marwa Sz RN on 06/21/2020.



Using sterile technique and 1% Lidocaine as local anesthetic, under
stereotactic guidance, a 9 gauge vacuum assisted device was used to
perform core needle biopsy of an asymmetry in the upper outer right
breast using a superior approach.

Lesion quadrant: Upper outer quadrant

At the conclusion of the procedure, coil tissue marker clip was
deployed into the biopsy cavity. Follow-up 2-view mammogram was
performed and dictated separately.
IMPRESSION: Stereotactic-guided biopsy of an asymmetry in the upper outer right
breast. No apparent complications.

## 2021-10-17 DIAGNOSIS — R0683 Snoring: Secondary | ICD-10-CM | POA: Insufficient documentation

## 2021-11-01 DIAGNOSIS — Z860100 Personal history of colon polyps, unspecified: Secondary | ICD-10-CM | POA: Insufficient documentation

## 2022-01-09 DIAGNOSIS — I890 Lymphedema, not elsewhere classified: Secondary | ICD-10-CM | POA: Insufficient documentation

## 2022-04-17 DIAGNOSIS — M722 Plantar fascial fibromatosis: Secondary | ICD-10-CM | POA: Insufficient documentation

## 2022-04-17 DIAGNOSIS — M79672 Pain in left foot: Secondary | ICD-10-CM | POA: Insufficient documentation

## 2022-07-10 DIAGNOSIS — M6702 Short Achilles tendon (acquired), left ankle: Secondary | ICD-10-CM | POA: Insufficient documentation

## 2023-11-20 ENCOUNTER — Encounter (HOSPITAL_BASED_OUTPATIENT_CLINIC_OR_DEPARTMENT_OTHER): Payer: Self-pay | Admitting: Emergency Medicine

## 2023-11-20 ENCOUNTER — Other Ambulatory Visit (HOSPITAL_BASED_OUTPATIENT_CLINIC_OR_DEPARTMENT_OTHER): Payer: Self-pay

## 2023-11-20 ENCOUNTER — Ambulatory Visit (HOSPITAL_BASED_OUTPATIENT_CLINIC_OR_DEPARTMENT_OTHER): Payer: Self-pay | Admitting: Family Medicine

## 2023-11-20 ENCOUNTER — Ambulatory Visit (INDEPENDENT_AMBULATORY_CARE_PROVIDER_SITE_OTHER): Admit: 2023-11-20 | Discharge: 2023-11-20 | Disposition: A | Discharge status: Home / Self Care | Admitting: Radiology

## 2023-11-20 ENCOUNTER — Ambulatory Visit (HOSPITAL_BASED_OUTPATIENT_CLINIC_OR_DEPARTMENT_OTHER)
Admission: EM | Admit: 2023-11-20 | Discharge: 2023-11-20 | Disposition: A | Discharge status: Home / Self Care | Attending: Family Medicine | Admitting: Family Medicine

## 2023-11-20 DIAGNOSIS — R0689 Other abnormalities of breathing: Secondary | ICD-10-CM

## 2023-11-20 DIAGNOSIS — R0902 Hypoxemia: Secondary | ICD-10-CM

## 2023-11-20 DIAGNOSIS — R609 Edema, unspecified: Secondary | ICD-10-CM

## 2023-11-20 DIAGNOSIS — R0602 Shortness of breath: Secondary | ICD-10-CM

## 2023-11-20 DIAGNOSIS — R0989 Other specified symptoms and signs involving the circulatory and respiratory systems: Secondary | ICD-10-CM | POA: Diagnosis not present

## 2023-11-20 DIAGNOSIS — I10 Essential (primary) hypertension: Secondary | ICD-10-CM

## 2023-11-20 DIAGNOSIS — J189 Pneumonia, unspecified organism: Secondary | ICD-10-CM

## 2023-11-20 MED ORDER — LISINOPRIL-HYDROCHLOROTHIAZIDE 10-12.5 MG PO TABS
1.0000 | ORAL_TABLET | Freq: Every day | ORAL | 0 refills | Status: AC
  Filled 2023-11-20: qty 30, 30d supply, fill #0

## 2023-11-20 MED ORDER — FUROSEMIDE 20 MG PO TABS
20.0000 mg | ORAL_TABLET | Freq: Every day | ORAL | 0 refills | Status: AC
  Filled 2023-11-20: qty 3, 3d supply, fill #0

## 2023-11-20 MED ORDER — POTASSIUM CHLORIDE CRYS ER 20 MEQ PO TBCR
20.0000 meq | EXTENDED_RELEASE_TABLET | Freq: Every day | ORAL | 0 refills | Status: DC
  Filled 2023-11-20: qty 3, 3d supply, fill #0

## 2023-11-20 MED ORDER — DOXYCYCLINE HYCLATE 100 MG PO CAPS
100.0000 mg | ORAL_CAPSULE | Freq: Two times a day (BID) | ORAL | 0 refills | Status: AC
  Filled 2023-11-20: qty 20, 10d supply, fill #0

## 2023-11-20 NOTE — ED Triage Notes (Signed)
 Pt c/o shortness of breath started about a month ago she was prescribed a inhaler but no better. Pt reports her legs feel heavy and she feels like they have fluid in them started x 1 week ago.

## 2023-11-20 NOTE — Discharge Instructions (Addendum)
 Probable community-acquired pneumonia versus congestive heart failure chest congestion, decreased breath sounds in the right lower lung and shortness of breath: Chest x-ray is hazy with what appears to be consolidation in the bases.  Will treat for community-acquired pneumonia based on her clinical signs and symptoms and exam findings.  Doxycycline  100 mg twice daily for 10 days.  Continue albuterol  inhaler, 2 puffs every 4 hours as needed for wheezing.  Chest congestion versus possible early congestive heart failure with significant dependent edema: Furosemide  20 mg 1 daily for 3 days.  Will check labs and add potassium tomorrow if needed.  Provided a prescription for Klor-Con  20 mill equivalents daily for 3 days and instructed to hold this until we get her test results.  Hypertension with significant dependent lymphedema: Patient needs to be on an antihypertensive to help her blood pressure and possibly reduce the dependent edema.  Use lisinopril  hydrochlorothiazide , 10/12.5 mg, 1 pill daily.  Labs are pending.  Will adjust the plan of care, if needed once the labs result.  Patient needs to connect with primary care and get further evaluation of her dependent edema and hypertension.  Follow-up here as needed.

## 2023-11-20 NOTE — ED Provider Notes (Addendum)
 [facility]    CSN: [id number] Arrival date & time: 11/20/23  1142      History   Chief Complaint Chief Complaint  Patient presents with   Shortness of Breath   Leg Swelling    HPI Betty Miranda is a 68 y.o. female.   68 year old female who is morbidly obese.  She has hypertension and hyperlipidemia but is not currently seeing primary care and does not feel that she needs any medication so she is not taking any medications at this time.  She has had cough and congestion and coughing up yellow-green sputum since approximately 10/13/2023 or earlier.  On approximately 10/19/2023, she became short of breath and has been short of breath intermittently since that time.  She has previously been managed by contact her health and saw them recently.  She got an albuterol  inhaler for shortness of breath but really felt like they did not listen to her and did not examine her well.  She reports she has been seen at an emergency room previously and was told that she did have significant swelling in her legs but did not have congestive heart failure.  She is not sure how long ago that visit was but it has been in the last year.  She is concerned that her legs are full of fluid and feel very heavy.  She walks with a cane.   Shortness of Breath Associated symptoms: cough   Associated symptoms: no abdominal pain, no chest pain, no ear pain, no fever, no rash, no sore throat and no vomiting     Past Medical History:  Diagnosis Date   Anginal pain (HCC)    Arthritis    Dyslipidemia    Headache    Hypertension    Hypothyroidism    Pneumonia    Shortness of breath dyspnea     Patient Active Problem List   Diagnosis Date Noted   Kidney stone on left side 08/10/2014   Chest pain 08/09/2014   Abdominal pain, left lower quadrant 08/09/2014   Hypokalemia 08/09/2014   UTI (urinary tract infection) 08/09/2014   Constipation 08/09/2014   Elevated WBC count 08/09/2014   Essential hypertension  08/09/2014   Hyperlipidemia 08/09/2014   Acute cystitis without hematuria     Past Surgical History:  Procedure Laterality Date   CARPAL TUNNEL RELEASE Bilateral    FOOT ARTHRODESIS, MODIFIED MCBRIDE Left    FOOT SURGERY Right    Toe bone removal and spur removals   JOINT REPLACEMENT     KNEE SURGERY     ROTATOR CUFF REPAIR Left    SHOULDER SURGERY Right    to remove bone spur    OB History   No obstetric history on file.      Home Medications    Prior to Admission medications   Medication Sig Start Date End Date Taking? Authorizing Provider  albuterol  (PROVENTIL  HFA;VENTOLIN  HFA) 108 (90 Base) MCG/ACT inhaler Inhale 1-2 puffs every 6 (six) hours as needed into the lungs for wheezing. 03/01/17  Yes Lynwood Anes, MD  doxycycline  (VIBRAMYCIN ) 100 MG capsule Take 1 capsule (100 mg total) by mouth 2 (two) times daily for 10 days. 11/20/23 11/30/23 Yes Ival Domino, FNP  furosemide  (LASIX ) 20 MG tablet Take 1 tablet (20 mg total) by mouth daily for 3 days. 11/20/23 11/23/23 Yes Ival Domino, FNP  lisinopril -hydrochlorothiazide  (ZESTORETIC ) 10-12.5 MG tablet Take 1 tablet by mouth daily. 11/20/23 12/20/23 Yes Ival Domino, FNP  potassium chloride  SA (KLOR-CON  M) 20  MEQ tablet Take 1 tablet (20 mEq total) by mouth daily for 3 days. 11/20/23 11/23/23 Yes Ival Domino, FNP    Family History Family History  Problem Relation Age of Onset   Stroke Mother    Heart attack Father 63       died at 46   Stroke Sister     Social History Social History   Tobacco Use   Smoking status: Never   Smokeless tobacco: Never  Vaping Use   Vaping status: Never Used  Substance Use Topics   Alcohol use: No   Drug use: No     Allergies   Sulfa antibiotics   Review of Systems Review of Systems  Constitutional:  Negative for chills and fever.  HENT:  Positive for congestion, postnasal drip and rhinorrhea. Negative for ear pain and sore throat.   Eyes:  Negative for pain and visual disturbance.   Respiratory:  Positive for cough and shortness of breath.   Cardiovascular:  Positive for leg swelling. Negative for chest pain and palpitations.  Gastrointestinal:  Negative for abdominal pain, constipation, diarrhea, nausea and vomiting.  Genitourinary:  Negative for dysuria and hematuria.  Musculoskeletal:  Negative for arthralgias and back pain.  Skin:  Negative for color change and rash.  Neurological:  Negative for seizures and syncope.  All other systems reviewed and are negative.    Physical Exam Triage Vital Signs ED Triage Vitals  Encounter Vitals Group     BP 11/20/23 1156 (!) 148/100     Girls Systolic BP Percentile --      Girls Diastolic BP Percentile --      Boys Systolic BP Percentile --      Boys Diastolic BP Percentile --      Pulse Rate 11/20/23 1156 88     Resp 11/20/23 1156 (!) 22     Temp 11/20/23 1156 98 F (36.7 C)     Temp Source 11/20/23 1156 Oral     SpO2 11/20/23 1156 90 %     Weight --      Height --      Head Circumference --      Peak Flow --      Pain Score 11/20/23 1154 9     Pain Loc --      Pain Education --      Exclude from Growth Chart --    No data found.  Updated Vital Signs BP (!) 148/100 (BP Location: Right Arm)   Pulse 88   Temp 98 F (36.7 C) (Oral)   Resp (!) 22   SpO2 90%   Visual Acuity Right Eye Distance:   Left Eye Distance:   Bilateral Distance:    Right Eye Near:   Left Eye Near:    Bilateral Near:     Physical Exam Vitals and nursing note reviewed.  Constitutional:      General: She is not in acute distress.    Appearance: She is well-developed. She is morbidly obese. She is not ill-appearing, toxic-appearing or diaphoretic.  HENT:     Head: Normocephalic and atraumatic.     Right Ear: Hearing, tympanic membrane, ear canal and external ear normal.     Left Ear: Hearing, tympanic membrane, ear canal and external ear normal.     Nose: No congestion or rhinorrhea.     Right Sinus: No maxillary sinus  tenderness or frontal sinus tenderness.     Left Sinus: No maxillary sinus tenderness or frontal sinus tenderness.  Mouth/Throat:     Lips: Pink.     Mouth: Mucous membranes are moist.     Pharynx: Uvula midline. No oropharyngeal exudate or posterior oropharyngeal erythema.     Tonsils: No tonsillar exudate.  Eyes:     Conjunctiva/sclera: Conjunctivae normal.     Pupils: Pupils are equal, round, and reactive to light.  Cardiovascular:     Rate and Rhythm: Normal rate and regular rhythm.     Pulses:          Dorsalis pedis pulses are 1+ on the right side and 1+ on the left side.       Posterior tibial pulses are 1+ on the right side and 1+ on the left side.     Heart sounds: S1 normal and S2 normal. No murmur heard. Pulmonary:     Effort: Pulmonary effort is normal. No respiratory distress.     Breath sounds: Examination of the right-upper field reveals rales. Examination of the left-upper field reveals rales. Examination of the right-middle field reveals rales. Examination of the left-middle field reveals rales. Examination of the left-lower field reveals decreased breath sounds. Decreased breath sounds and rales present. No wheezing or rhonchi.  Abdominal:     General: Bowel sounds are normal.     Palpations: Abdomen is soft.     Tenderness: There is no abdominal tenderness.  Musculoskeletal:        General: No swelling.     Cervical back: Neck supple.     Right upper leg: Swelling and edema present.     Left upper leg: Swelling and edema present.     Right knee: Swelling present.     Left knee: Swelling present.     Right lower leg: Swelling present. 4+ Pitting Edema present.     Left lower leg: Swelling present. 4+ Pitting Edema present.     Right ankle: Swelling present.     Left ankle: Swelling present.     Right foot: Swelling present.     Left foot: Swelling present.  Lymphadenopathy:     Head:     Right side of head: No submental, submandibular, tonsillar,  preauricular or posterior auricular adenopathy.     Left side of head: No submental, submandibular, tonsillar, preauricular or posterior auricular adenopathy.     Cervical: No cervical adenopathy.     Right cervical: No superficial cervical adenopathy.    Left cervical: No superficial cervical adenopathy.  Skin:    General: Skin is warm and dry.     Capillary Refill: Capillary refill takes less than 2 seconds.     Findings: No rash.  Neurological:     Mental Status: She is alert and oriented to person, place, and time.  Psychiatric:        Mood and Affect: Mood normal.      UC Treatments / Results  Labs (all labs ordered are listed, but only abnormal results are displayed) Labs Reviewed  CBC WITH DIFFERENTIAL/PLATELET  COMPREHENSIVE METABOLIC PANEL WITH GFR    EKG   Radiology No results found.  Procedures Procedures (including critical care time)  Medications Ordered in UC Medications - No data to display  Initial Impression / Assessment and Plan / UC Course  I have reviewed the triage vital signs and the nursing notes.  Pertinent labs & imaging results that were available during my care of the patient were reviewed by me and considered in my medical decision making (see chart for details).  Plan of Care: Community-acquired  pneumonia with chest congestion, shortness of breath, cough and hypoxia: Chest x-ray appears to have some consolidation in the bases and is very hazy.  Based on her vital signs and exam findings, will treat for community-acquired pneumonia versus congestive heart failure.  Doxycycline  100 mg twice daily for 10 days.  Continue albuterol  inhaler, 2 puffs every 4 hours if needed for wheezing.  Deep tendon edema, shortness of breath and chest congestion: This could be congestive heart failure.  Will treat with furosemide  20 mg daily for 3 days.  Lab work is pending.  Will adjust the plan of care, if needed once the labs result.  May need Klor-Con  20 mill  equivalents daily but will hold on that for now and wait until the labs result.  Uncontrolled hypertension and dependent edema: Needs to start lisinopril -hydrochlorothiazide , 10/12.5 mg daily for blood pressure and chronic edema.  She is to get set up with primary care and have regular management of her chronic health conditions.  Follow-up if symptoms do not improve, worsen or new symptoms occur.   I reviewed the plan of care with the patient and/or the patient's guardian.  The patient and/or guardian had time to ask questions and acknowledged that the questions were answered.  I provided instruction on symptoms or reasons to return here or to go to an ER, if symptoms/condition did not improve, worsened or if new symptoms occurred.  Final Clinical Impressions(s) / UC Diagnoses   Final diagnoses:  Shortness of breath  Decreased breath sounds at right lung base  Dependent edema  Accelerated hypertension  Chest congestion  Community acquired pneumonia, unspecified laterality  Hypoxia     Discharge Instructions      Probable community-acquired pneumonia versus congestive heart failure chest congestion, decreased breath sounds in the right lower lung and shortness of breath: Chest x-ray is hazy with what appears to be consolidation in the bases.  Will treat for community-acquired pneumonia based on her clinical signs and symptoms and exam findings.  Doxycycline  100 mg twice daily for 10 days.  Continue albuterol  inhaler, 2 puffs every 4 hours as needed for wheezing.  Chest congestion versus possible early congestive heart failure with significant dependent edema: Furosemide  20 mg 1 daily for 3 days.  Will check labs and add potassium tomorrow if needed.  Provided a prescription for Klor-Con  20 mill equivalents daily for 3 days and instructed to hold this until we get her test results.  Hypertension with significant dependent lymphedema: Patient needs to be on an antihypertensive to help her  blood pressure and possibly reduce the dependent edema.  Use lisinopril  hydrochlorothiazide , 10/12.5 mg, 1 pill daily.  Labs are pending.  Will adjust the plan of care, if needed once the labs result.  Patient needs to connect with primary care and get further evaluation of her dependent edema and hypertension.  Follow-up here as needed.     ED Prescriptions     Medication Sig Dispense Auth. Provider   doxycycline  (VIBRAMYCIN ) 100 MG capsule Take 1 capsule (100 mg total) by mouth 2 (two) times daily for 10 days. 20 capsule Ival Domino, FNP   lisinopril -hydrochlorothiazide  (ZESTORETIC ) 10-12.5 MG tablet Take 1 tablet by mouth daily. 30 tablet Lucill Mauck, FNP   furosemide  (LASIX ) 20 MG tablet Take 1 tablet (20 mg total) by mouth daily for 3 days. 3 tablet Marvion Bastidas, FNP   potassium chloride  SA (KLOR-CON  M) 20 MEQ tablet Take 1 tablet (20 mEq total) by mouth daily for 3 days. 3  tablet Ival Domino, FNP      PDMP not reviewed this encounter.   Ival Domino, FNP 11/20/23 1315    Ival Domino, FNP 11/20/23 1318

## 2023-11-20 NOTE — Progress Notes (Signed)
 Chest x-ray was read as negative for no pneumonia.  Based on patient's clinical findings I decided to treat her for pneumonia but she was aware of the x-ray results.  I did call her to provide an updated report but still encouraged her to finish the antibiotics as prescribed.

## 2023-11-21 LAB — CBC WITH DIFFERENTIAL/PLATELET
Basophils Absolute: 0.1 x10E3/uL (ref 0.0–0.2)
Basos: 1 %
EOS (ABSOLUTE): 0.4 x10E3/uL (ref 0.0–0.4)
Eos: 4 %
Hematocrit: 45.6 % (ref 34.0–46.6)
Hemoglobin: 14.8 g/dL (ref 11.1–15.9)
Immature Grans (Abs): 0.1 x10E3/uL (ref 0.0–0.1)
Immature Granulocytes: 1 %
Lymphocytes Absolute: 1.3 x10E3/uL (ref 0.7–3.1)
Lymphs: 13 %
MCH: 29.8 pg (ref 26.6–33.0)
MCHC: 32.5 g/dL (ref 31.5–35.7)
MCV: 92 fL (ref 79–97)
Monocytes Absolute: 0.7 x10E3/uL (ref 0.1–0.9)
Monocytes: 7 %
Neutrophils Absolute: 7.5 x10E3/uL — ABNORMAL HIGH (ref 1.4–7.0)
Neutrophils: 74 %
Platelets: 231 x10E3/uL (ref 150–450)
RBC: 4.96 x10E6/uL (ref 3.77–5.28)
RDW: 13.2 % (ref 11.7–15.4)
WBC: 10.1 x10E3/uL (ref 3.4–10.8)

## 2023-11-21 LAB — COMPREHENSIVE METABOLIC PANEL WITH GFR
ALT: 17 IU/L (ref 0–32)
AST: 24 IU/L (ref 0–40)
Albumin: 4.1 g/dL (ref 3.9–4.9)
Alkaline Phosphatase: 145 IU/L — ABNORMAL HIGH (ref 44–121)
BUN/Creatinine Ratio: 19 (ref 12–28)
BUN: 13 mg/dL (ref 8–27)
Bilirubin Total: 0.5 mg/dL (ref 0.0–1.2)
CO2: 21 mmol/L (ref 20–29)
Calcium: 9.2 mg/dL (ref 8.7–10.3)
Chloride: 102 mmol/L (ref 96–106)
Creatinine, Ser: 0.7 mg/dL (ref 0.57–1.00)
Globulin, Total: 2.4 g/dL (ref 1.5–4.5)
Glucose: 105 mg/dL — ABNORMAL HIGH (ref 70–99)
Potassium: 4 mmol/L (ref 3.5–5.2)
Sodium: 141 mmol/L (ref 134–144)
Total Protein: 6.5 g/dL (ref 6.0–8.5)
eGFR: 95 mL/min/1.73 (ref >59)

## 2023-11-21 NOTE — Progress Notes (Signed)
 Complete blood count is normal with marginally elevated neutrophils.  The elevated neutrophils could be a early sign of infection and she is on antibiotics for possible early pneumonia.  Chemistry panel is excellent.  Her blood sugar was 105 but she was nonfasting.  She has great kidney and liver function.  Her potassium is 4.0.  Her potassium is normal so I am going to have her taking furosemide  20 mg daily for 3 days and I instructed her to take the Klor-Con  20 mill equivalents daily for 3 days so that her potassium does not go low with use of the furosemide .  Encouraged to follow-up with Dr. Dottie as planned.  I was not able to reach her in person but the above information was left on her voicemail.

## 2023-11-26 ENCOUNTER — Encounter (HOSPITAL_BASED_OUTPATIENT_CLINIC_OR_DEPARTMENT_OTHER): Payer: Self-pay | Admitting: Family Medicine

## 2023-11-26 ENCOUNTER — Ambulatory Visit (INDEPENDENT_AMBULATORY_CARE_PROVIDER_SITE_OTHER): Admitting: Family Medicine

## 2023-11-26 VITALS — BP 144/83 | HR 89 | Temp 98.2°F | Resp 18 | Ht 60.0 in | Wt 352.8 lb

## 2023-11-26 DIAGNOSIS — R5382 Chronic fatigue, unspecified: Secondary | ICD-10-CM

## 2023-11-26 DIAGNOSIS — Z1231 Encounter for screening mammogram for malignant neoplasm of breast: Secondary | ICD-10-CM | POA: Insufficient documentation

## 2023-11-26 DIAGNOSIS — I890 Lymphedema, not elsewhere classified: Secondary | ICD-10-CM | POA: Diagnosis not present

## 2023-11-26 DIAGNOSIS — G4733 Obstructive sleep apnea (adult) (pediatric): Secondary | ICD-10-CM | POA: Insufficient documentation

## 2023-11-26 DIAGNOSIS — R0600 Dyspnea, unspecified: Secondary | ICD-10-CM | POA: Insufficient documentation

## 2023-11-26 DIAGNOSIS — E782 Mixed hyperlipidemia: Secondary | ICD-10-CM

## 2023-11-26 NOTE — Assessment & Plan Note (Signed)
 Arrange for a FLP soon, and will certainly encourage a statin based on her considerable risks if she is agreeable.  Extended discussion about weight loss and low impact exercise today.  She declines an appt with dietitian.

## 2023-11-26 NOTE — Assessment & Plan Note (Signed)
 Unclear etiology.  No evidence of a PE today.  I discussed the options of a Chest CT and/or referral to Pulm/Sleep specialist in [facility] (her preferred city).  She declines both of these for now.

## 2023-11-26 NOTE — Assessment & Plan Note (Signed)
 Handout provided for additional education.  I don't expect much relief with more aggressive diuresis.  She has already been to the Lymphedema clinic and declined what they offered.  She has no interest in Unna boots or similar wraps.

## 2023-11-26 NOTE — Assessment & Plan Note (Signed)
 She states she has an upcoming Mammogram scheduled and she was commended.

## 2023-11-26 NOTE — Progress Notes (Signed)
 Established Patient Office Visit  Subjective   Patient ID: Betty Miranda, female    DOB: [birth year]  Age: 68 y.o. MRN: [id number]  Chief Complaint  Patient presents with   Establish Care    Here to establish care.   Leg Swelling    Has been having some leg swelling. Was seen at [facility] on 11/20/2023.    Back Pain    Has had off and on back pain. Unsure of source or reason.    F/u as above.  Recently seen at our urgent care, but new to my practice.  Somewhat extensive PMH reviewed in detail today.  Chronic struggles with obesity and weight loss.  Occasional SOB at rest noted.  No CP or chest tightness.  Recent labs noted.  She is not fasting today.  Recent normal CXR noted.  Admits her stress level is pretty high.    Back Pain Pertinent negatives include no chest pain, fever or weight loss.    Past Medical History:  Diagnosis Date   Chronic acquired lymphedema    known to specialist in [place]   Dyslipidemia    Gait disorder    Hypertension    Medically noncompliant    Morbid obesity (HCC)    OSA (obstructive sleep apnea)    with rx declined   Osteoarthritis     Outpatient Encounter Medications as of 11/26/2023  Medication Sig   albuterol  (PROVENTIL  HFA;VENTOLIN  HFA) 108 (90 Base) MCG/ACT inhaler Inhale 1-2 puffs every 6 (six) hours as needed into the lungs for wheezing.   doxycycline  (VIBRAMYCIN ) 100 MG capsule Take 1 capsule (100 mg total) by mouth 2 (two) times daily for 10 days.   lisinopril -hydrochlorothiazide  (ZESTORETIC ) 10-12.5 MG tablet Take 1 tablet by mouth daily.   potassium chloride  SA (KLOR-CON  M) 20 MEQ tablet Take 1 tablet (20 mEq total) by mouth daily for 3 days.   furosemide  (LASIX ) 20 MG tablet Take 1 tablet (20 mg total) by mouth daily for 3 days. (Patient not taking: Reported on 11/26/2023)   No facility-administered encounter medications on file as of 11/26/2023.    Social History   Tobacco Use   Smoking status: Never    Passive exposure: Never    Smokeless tobacco: Never  Vaping Use   Vaping status: Never Used  Substance Use Topics   Alcohol use: No   Drug use: No      Review of Systems  Constitutional:  Positive for malaise/fatigue. Negative for diaphoresis, fever and weight loss.  Respiratory:  Positive for shortness of breath. Negative for cough and wheezing.   Cardiovascular:  Positive for leg swelling. Negative for chest pain, palpitations, orthopnea, claudication and PND.      Objective:     BP (!) 144/83   Pulse 89   Temp 98.2 F (36.8 C) (Oral)   Resp 18   Ht 5' (1.524 m)   Wt (!) 352 lb 12.8 oz (160 kg)   SpO2 95%   BMI 68.90 kg/m    Physical Exam Constitutional:      General: She is not in acute distress.    Appearance: Normal appearance. She is obese.     Comments: Morbidly Obese.  Comfortable.  Nondyspneic at rest.  HENT:     Head: Normocephalic.  Neck:     Vascular: No carotid bruit.  Cardiovascular:     Rate and Rhythm: Normal rate and regular rhythm.     Pulses: Normal pulses.     Heart sounds: Normal heart sounds.  Pulmonary:     Effort: Pulmonary effort is normal.     Breath sounds: Normal breath sounds.  Abdominal:     General: Bowel sounds are normal.     Palpations: Abdomen is soft.     Tenderness: There is no abdominal tenderness.  Musculoskeletal:     Cervical back: Neck supple. No tenderness.     Right lower leg: Edema present.     Left lower leg: Edema present.     Comments: Severe bilateral LE chronic Lymphedema.  1+ pedal pulses.  Neurological:     Mental Status: She is alert.      No results found for any visits on 11/26/23.    The 10-year ASCVD risk score (Arnett DK, et al., 2019) is: 10.7%    Assessment & Plan:  Mixed hyperlipidemia Assessment & Plan: Arrange for a FLP soon, and will certainly encourage a statin based on her considerable risks if she is agreeable.  Extended discussion about weight loss and low impact exercise today.  She declines an appt  with dietitian.  Orders: -     Lipid panel; Future  Morbid obesity (HCC)  Breast cancer screening by mammogram Assessment & Plan: She states she has an upcoming Mammogram scheduled and she was commended.   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  I don't expect much relief with more aggressive diuresis.  She has already been to the Lymphedema clinic and declined what they offered.  She has no interest in Unna boots or similar wraps.  Orders: -     Brain natriuretic peptide; Future  Chronic fatigue -     TSH; Future -     Vitamin B12; Future  OSA (obstructive sleep apnea) Assessment & Plan: I urged her to reconsider treatment.  She quickly declines CPAP.  I reminded her that there are other treatment options, but she has no interest at this time.  I suspect this is the primary cause of her occasional resting hypoxia.   Dyspnea, unspecified type Assessment & Plan: Unclear etiology.  No evidence of a PE today.  I discussed the options of a Chest CT and/or referral to Pulm/Sleep specialist in [facility] (her preferred city).  She declines both of these for now.     Return in about 4 weeks (around 12/24/2023) for chronic follow-up.    REDDING PONCE NORLEEN FALCON., MD

## 2023-11-26 NOTE — Assessment & Plan Note (Signed)
 I urged her to reconsider treatment.  She quickly declines CPAP.  I reminded her that there are other treatment options, but she has no interest at this time.  I suspect this is the primary cause of her occasional resting hypoxia.

## 2023-12-02 ENCOUNTER — Other Ambulatory Visit (INDEPENDENT_AMBULATORY_CARE_PROVIDER_SITE_OTHER)

## 2023-12-02 DIAGNOSIS — E782 Mixed hyperlipidemia: Secondary | ICD-10-CM

## 2023-12-02 DIAGNOSIS — I890 Lymphedema, not elsewhere classified: Secondary | ICD-10-CM | POA: Diagnosis not present

## 2023-12-02 DIAGNOSIS — R5382 Chronic fatigue, unspecified: Secondary | ICD-10-CM

## 2023-12-03 ENCOUNTER — Ambulatory Visit: Payer: Self-pay

## 2023-12-03 NOTE — Telephone Encounter (Signed)
 Patient denies higher acuity questions. ED precautions reviewed, pt verbalized understanding.  FYI Only or Action Required?: FYI only for provider.  Patient was last seen in primary care on 11/26/2023 by Dottie [facility]leen PHEBE PONCE, MD.  Called Nurse Triage reporting Leg Swelling.  Symptoms began Chronic lyphedema.  Interventions attempted: Rest, hydration, or home remedies.  Symptoms are: unchanged.  Triage Disposition: See Physician Within 24 Hours  Patient/caregiver understands and will follow disposition?: Yes Reason for Disposition  [1] MODERATE leg swelling (e.g., swelling extends up to knees) AND [2] new-onset or getting worse  Answer Assessment - Initial Assessment Questions Pt reports continued pain and no improvement in lymphedema symptoms and would like sooner recheck.  1. LOCATION: What part of the leg is swollen?  Are both legs swollen or just one leg?     Bilateral  2. SEVERITY: How bad is the swelling? (e.g., localized; mild, moderate, severe)     Severe  3. REDNESS: Is there redness or signs of infection?     States slightly red, but that she believes it is because she was rubbing them  4. PAIN: Is the swelling painful to touch? If Yes, ask: How painful is it?   (Scale 1-10; mild, moderate or severe)     Moderate to severe  5. CAUSE: What do you think is causing the leg swelling?     Dx with lymphedema  Protocols used: Leg Swelling and Edema-A-AH Copied from CRM #[id number]. Topic: Clinical - Red Word Triage >> Dec 03, 2023  3:20 PM Charlet HERO wrote: Red Word that prompted transfer to Nurse Triage: Patient is calling about her legs being swollen and red she is stating that they hurt really bad.

## 2023-12-04 ENCOUNTER — Encounter (HOSPITAL_BASED_OUTPATIENT_CLINIC_OR_DEPARTMENT_OTHER): Payer: Self-pay | Admitting: Family Medicine

## 2023-12-04 ENCOUNTER — Ambulatory Visit (HOSPITAL_BASED_OUTPATIENT_CLINIC_OR_DEPARTMENT_OTHER): Admitting: Family Medicine

## 2023-12-04 VITALS — BP 123/80 | HR 82 | Temp 98.0°F | Resp 18 | Ht 60.0 in | Wt 355.5 lb

## 2023-12-04 DIAGNOSIS — I890 Lymphedema, not elsewhere classified: Secondary | ICD-10-CM | POA: Diagnosis not present

## 2023-12-04 DIAGNOSIS — I10 Essential (primary) hypertension: Secondary | ICD-10-CM | POA: Diagnosis not present

## 2023-12-04 NOTE — Progress Notes (Signed)
 Established Patient Office Visit  Subjective   Patient ID: Betty Miranda, female    DOB: 1956-04-05  Age: 68 y.o. MRN: [id number]  Chief Complaint  Patient presents with   Leg Swelling    Leg swelling.    F/u as above.  Please see last note for details.  We've already discussed management of her chronic Lymphedema extensively.  She has decided to start wearing properly fitted support stockings and I agree with this decision.    Past Medical History:  Diagnosis Date   Chronic acquired lymphedema    known to specialist in [place]   Dyslipidemia    Gait disorder    Hypertension    Medically noncompliant    Morbid obesity (HCC)    Rx declined   OSA (obstructive sleep apnea)    with rx declined   Osteoarthritis     Outpatient Encounter Medications as of 12/04/2023  Medication Sig   albuterol  (PROVENTIL  HFA;VENTOLIN  HFA) 108 (90 Base) MCG/ACT inhaler Inhale 1-2 puffs every 6 (six) hours as needed into the lungs for wheezing.   lisinopril -hydrochlorothiazide  (ZESTORETIC ) 10-12.5 MG tablet Take 1 tablet by mouth daily.   [DISCONTINUED] potassium chloride  SA (KLOR-CON  M) 20 MEQ tablet Take 1 tablet (20 mEq total) by mouth daily for 3 days.   furosemide  (LASIX ) 20 MG tablet Take 1 tablet (20 mg total) by mouth daily for 3 days. (Patient not taking: Reported on 12/04/2023)   No facility-administered encounter medications on file as of 12/04/2023.    Social History   Tobacco Use   Smoking status: Never    Passive exposure: Never   Smokeless tobacco: Never  Vaping Use   Vaping status: Never Used  Substance Use Topics   Alcohol use: No   Drug use: No      Review of Systems  Constitutional:  Negative for diaphoresis, fever, malaise/fatigue and weight loss.  Respiratory:  Negative for cough, shortness of breath and wheezing.   Cardiovascular:  Positive for leg swelling. Negative for chest pain, palpitations, orthopnea, claudication and PND.      Objective:     BP  123/80   Pulse 82   Temp 98 F (36.7 C) (Oral)   Resp 18   Ht 5' (1.524 m)   Wt (!) 355 lb 8 oz (161.3 kg)   SpO2 95%   BMI 69.43 kg/m    Physical Exam Constitutional:      General: She is not in acute distress.    Appearance: Normal appearance. She is obese.  HENT:     Head: Normocephalic.  Neck:     Vascular: No carotid bruit.  Cardiovascular:     Rate and Rhythm: Normal rate and regular rhythm.     Pulses: Normal pulses.     Heart sounds: Normal heart sounds.  Pulmonary:     Effort: Pulmonary effort is normal.     Breath sounds: Normal breath sounds.  Abdominal:     General: Bowel sounds are normal.     Palpations: Abdomen is soft.  Musculoskeletal:     Cervical back: Neck supple. No tenderness.     Right lower leg: Edema present.     Left lower leg: Edema present.     Comments: Chronic severe bilateral LE Lymphedema  Neurological:     Mental Status: She is alert.      No results found for any visits on 12/04/23.    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  Postural changes.  She will soon try properly fitted support stockings and will alert me if she ultimately decides to return to a Lymphedema clinic.   Morbid obesity (HCC) Assessment & Plan: Weight loss strategies again briefly reviewed.   Essential hypertension Assessment & Plan: Controlled.     Return in about 3 months (around 03/05/2024) for chronic follow-up.    REDDING PONCE NORLEEN FALCON., MD

## 2023-12-04 NOTE — Assessment & Plan Note (Signed)
 Controlled.

## 2023-12-04 NOTE — Assessment & Plan Note (Signed)
 DASH diet.  Postural changes.  She will soon try properly fitted support stockings and will alert me if she ultimately decides to return to a Lymphedema clinic.

## 2023-12-04 NOTE — Assessment & Plan Note (Signed)
 Weight loss strategies again briefly reviewed.

## 2023-12-08 ENCOUNTER — Ambulatory Visit (HOSPITAL_BASED_OUTPATIENT_CLINIC_OR_DEPARTMENT_OTHER): Payer: Self-pay | Admitting: Student

## 2023-12-08 LAB — LIPID PANEL
Chol/HDL Ratio: 3.3 ratio (ref 0.0–4.4)
Cholesterol, Total: 209 mg/dL — ABNORMAL HIGH (ref 100–199)
HDL: 64 mg/dL (ref >39)
LDL Chol Calc (NIH): 123 mg/dL — ABNORMAL HIGH (ref 0–99)
Triglycerides: 124 mg/dL (ref 0–149)
VLDL Cholesterol Cal: 22 mg/dL (ref 5–40)

## 2023-12-08 LAB — VITAMIN B12: Vitamin B-12: 501 pg/mL (ref 232–1245)

## 2023-12-08 LAB — TSH: TSH: 3.63 u[IU]/mL (ref 0.450–4.500)

## 2023-12-08 LAB — BRAIN NATRIURETIC PEPTIDE

## 2023-12-10 ENCOUNTER — Telehealth (HOSPITAL_BASED_OUTPATIENT_CLINIC_OR_DEPARTMENT_OTHER): Payer: Self-pay | Admitting: *Deleted

## 2023-12-24 ENCOUNTER — Encounter (HOSPITAL_BASED_OUTPATIENT_CLINIC_OR_DEPARTMENT_OTHER): Payer: Self-pay | Admitting: Family Medicine

## 2023-12-24 ENCOUNTER — Ambulatory Visit (HOSPITAL_BASED_OUTPATIENT_CLINIC_OR_DEPARTMENT_OTHER): Admitting: Family Medicine

## 2023-12-24 VITALS — BP 131/80 | HR 83 | Resp 17 | Wt 341.8 lb

## 2023-12-24 DIAGNOSIS — G4733 Obstructive sleep apnea (adult) (pediatric): Secondary | ICD-10-CM

## 2023-12-24 DIAGNOSIS — E039 Hypothyroidism, unspecified: Secondary | ICD-10-CM | POA: Diagnosis not present

## 2023-12-24 DIAGNOSIS — I890 Lymphedema, not elsewhere classified: Secondary | ICD-10-CM

## 2023-12-24 DIAGNOSIS — E785 Hyperlipidemia, unspecified: Secondary | ICD-10-CM | POA: Insufficient documentation

## 2023-12-24 DIAGNOSIS — G8929 Other chronic pain: Secondary | ICD-10-CM | POA: Insufficient documentation

## 2023-12-24 DIAGNOSIS — E782 Mixed hyperlipidemia: Secondary | ICD-10-CM | POA: Diagnosis not present

## 2023-12-24 DIAGNOSIS — R7303 Prediabetes: Secondary | ICD-10-CM | POA: Insufficient documentation

## 2023-12-24 NOTE — Progress Notes (Unsigned)
   Established Patient Office Visit  Subjective   Patient ID: Betty Miranda, female    DOB: Aug 22, 1955  Age: 68 y.o. MRN: [id number]  Chief Complaint  Patient presents with   hospital f/u    Hospital f/u    F/u as above.  Please see last note for details.  Note she was at [facility] a few weeks ago.  She appears improved but we don't have a d/c summary, which will obviously be requested.  Note she just had a Mammogram.    Past Medical History:  Diagnosis Date   Chronic acquired lymphedema    known to specialist in [place]   Dyslipidemia    Gait disorder    Hypertension    Medically noncompliant    Morbid obesity (HCC)    Rx declined   OSA (obstructive sleep apnea)    with rx declined   Osteoarthritis     Outpatient Encounter Medications as of 12/24/2023  Medication Sig   albuterol  (PROVENTIL  HFA;VENTOLIN  HFA) 108 (90 Base) MCG/ACT inhaler Inhale 1-2 puffs every 6 (six) hours as needed into the lungs for wheezing.   furosemide  (LASIX ) 40 MG tablet Take 40 mg by mouth 2 (two) times daily.   lisinopril  (ZESTRIL ) 20 MG tablet Take 20 mg by mouth daily.   lisinopril -hydrochlorothiazide  (ZESTORETIC ) 10-12.5 MG tablet Take 1 tablet by mouth daily. (Patient taking differently: Take 20 tablets by mouth daily.)   Potassium Chloride  ER 20 MEQ TBCR Take 1 tablet by mouth 2 (two) times daily.   potassium chloride  SA (KLOR-CON  M) 20 MEQ tablet Take 20 mEq by mouth 2 (two) times daily.   furosemide  (LASIX ) 20 MG tablet Take 1 tablet (20 mg total) by mouth daily for 3 days. (Patient not taking: Reported on 12/24/2023)   No facility-administered encounter medications on file as of 12/24/2023.    Social History   Tobacco Use   Smoking status: Never    Passive exposure: Never   Smokeless tobacco: Never  Vaping Use   Vaping status: Never Used  Substance Use Topics   Alcohol use: No   Drug use: No    {History (Optional):23778}  ROS    Objective:     BP 131/80 (BP Location: Right Arm,  Patient Position: Sitting, Cuff Size: Large)   Pulse 83   Resp 17   Wt (!) 341 lb 12.8 oz (155 kg)   SpO2 96%   BMI 66.75 kg/m  {Vitals History (Optional):23777}  Physical Exam   No results found for any visits on 12/24/23.  {Labs (Optional):23779}  The 10-year ASCVD risk score (Arnett DK, et al., 2019) is: 8.4%    Assessment & Plan:  Mixed hyperlipidemia  Acquired hypothyroidism  OSA (obstructive sleep apnea) -     Ambulatory referral to Sleep Studies    Return in about 3 months (around 03/24/2024) for chronic follow-up.    REDDING PONCE NORLEEN FALCON., MD

## 2023-12-25 NOTE — Assessment & Plan Note (Signed)
 Stable.  Therapeutic options already reviewed in detail.

## 2023-12-25 NOTE — Assessment & Plan Note (Signed)
 Clinically stable.

## 2023-12-25 NOTE — Assessment & Plan Note (Signed)
 Statin held inhouse for unclear reasons.  Await records to review.

## 2023-12-25 NOTE — Assessment & Plan Note (Signed)
 She again declines additional treatments at this time.

## 2023-12-30 NOTE — Telephone Encounter (Signed)
 Pt. Aware.

## 2024-01-14 ENCOUNTER — Telehealth (HOSPITAL_BASED_OUTPATIENT_CLINIC_OR_DEPARTMENT_OTHER): Payer: Self-pay | Admitting: *Deleted

## 2024-01-14 NOTE — Telephone Encounter (Signed)
 Copied from CRM [id number]. Topic: Referral - Question >> Jan 14, 2024  1:29 PM Betty Miranda wrote: Patient is calling in requesting the status along with information regarding her referral for a sleep study. Informed patient of the referral being sent to [facility] per reviewing the chart. Patient is requested it be sent to [facility]. She is also requesting someone give her a call once the referral has been changed to [employer].

## 2024-01-26 ENCOUNTER — Institutional Professional Consult (permissible substitution): Admitting: Neurology

## 2024-02-09 ENCOUNTER — Institutional Professional Consult (permissible substitution): Admitting: Neurology

## 2024-02-24 ENCOUNTER — Ambulatory Visit (HOSPITAL_BASED_OUTPATIENT_CLINIC_OR_DEPARTMENT_OTHER)

## 2024-03-04 ENCOUNTER — Ambulatory Visit (HOSPITAL_BASED_OUTPATIENT_CLINIC_OR_DEPARTMENT_OTHER): Admitting: Family Medicine

## 2024-03-24 ENCOUNTER — Ambulatory Visit (HOSPITAL_BASED_OUTPATIENT_CLINIC_OR_DEPARTMENT_OTHER): Admitting: Family Medicine

## 2024-04-28 ENCOUNTER — Telehealth (HOSPITAL_BASED_OUTPATIENT_CLINIC_OR_DEPARTMENT_OTHER): Payer: Self-pay | Admitting: Family Medicine

## 2024-04-28 NOTE — Telephone Encounter (Signed)
 Copied from CRM [id number]. Topic: Referral - Request for Referral >> Apr 28, 2024  3:46 PM Hadassah PARAS wrote: Did the patient discuss referral with their provider in the last year? No (If No - schedule appointment) (If Yes - send message)  Appointment offered? No  Type of order/referral and detailed reason for visit: Orthopedic  Preference of office, provider, location: [facility]   If referral order, have you been seen by this specialty before? Yes (If Yes, this issue or another issue? When? Where? Last seen today, needs a new referral due to insurance   Can we respond through MyChart? No

## 2024-05-12 ENCOUNTER — Ambulatory Visit (HOSPITAL_BASED_OUTPATIENT_CLINIC_OR_DEPARTMENT_OTHER)

## 2024-05-12 ENCOUNTER — Encounter (HOSPITAL_BASED_OUTPATIENT_CLINIC_OR_DEPARTMENT_OTHER): Payer: Self-pay

## 2024-05-12 VITALS — BP 131/81 | Ht 60.0 in | Wt 336.0 lb

## 2024-05-12 DIAGNOSIS — Z0000 Encounter for general adult medical examination without abnormal findings: Secondary | ICD-10-CM | POA: Diagnosis not present

## 2024-05-12 NOTE — Progress Notes (Signed)
 " I connected with  Betty Miranda on 05/12/24 by a audio enabled telemedicine application and verified that I am speaking with the correct person using two identifiers.  Patient Location: Home  Provider Location: Home Office  Persons Participating in Visit: Patient.  I discussed the limitations of evaluation and management by telemedicine. The patient expressed understanding and agreed to proceed.  Vital Signs: Because this visit was a virtual/telehealth visit, some criteria may be missing or patient reported. Any vitals not documented were not able to be obtained and vitals that have been documented are patient reported.  Chief Complaint  Patient presents with   Medicare Wellness     Subjective:   Betty Miranda is a 69 y.o. female who presents for a Medicare Annual Wellness Visit.  Visit info / Clinical Intake: Medicare Wellness Visit Type:: Initial Annual Wellness Visit Persons participating in visit and providing information:: patient Medicare Wellness Visit Mode:: Telephone If telephone:: video declined Since this visit was completed virtually, some vitals may be partially provided or unavailable. Missing vitals are due to the limitations of the virtual format.: Documented vitals are patient reported If Telephone or Video please confirm:: I connected with patient using audio/video enable telemedicine. I verified patient identity with two identifiers, discussed telehealth limitations, and patient agreed to proceed. Patient Location:: home Provider Location:: home office Interpreter Needed?: No Pre-visit prep was completed: yes AWV questionnaire completed by patient prior to visit?: no Living arrangements:: (!) lives alone Patient's Overall Health Status Rating: very good Typical amount of pain: some Does pain affect daily life?: no Are you currently prescribed opioids?: no  Dietary Habits and Nutritional Risks How many meals a day?: 2 Eats fruit and vegetables daily?:  yes Most meals are obtained by: preparing own meals; having others provide food In the last 2 weeks, have you had any of the following?: none Diabetic:: no  Functional Status Activities of Daily Living (to include ambulation/medication): Independent Ambulation: Independent with device- listed below Home Assistive Devices/Equipment: Other (Comment); Cane Medication Administration: Independent Home Management (perform basic housework or laundry): Independent Manage your own finances?: yes Primary transportation is: driving Concerns about vision?: no *vision screening is required for WTM* Concerns about hearing?: no  Fall Screening Falls in the past year?: 0 Number of falls in past year: 0 Was there an injury with Fall?: 0 Fall Risk Category Calculator: 0 Patient Fall Risk Level: Low Fall Risk  Fall Risk Patient at Risk for Falls Due to: Impaired mobility Fall risk Follow up: Falls evaluation completed  Home and Transportation Safety: All rugs have non-skid backing?: N/A, no rugs All stairs or steps have railings?: N/A, no stairs Grab bars in the bathtub or shower?: yes Have non-skid surface in bathtub or shower?: yes Good home lighting?: yes Regular seat belt use?: yes Hospital stays in the last year:: no  Cognitive Assessment Difficulty concentrating, remembering, or making decisions? : no Will 6CIT or Mini Cog be Completed: yes What year is it?: 0 points What month is it?: 0 points Give patient an address phrase to remember (5 components): remember words apple , table, penny About what time is it?: 0 points Count backwards from 20 to 1: 0 points Say the months of the year in reverse: 0 points Repeat the address phrase from earlier: 0 points 6 CIT Score: 0 points  Advance Directives (For Healthcare) Does Patient Have a Medical Advance Directive?: No Would patient like information on creating a medical advance directive?: No - Patient declined  Reviewed/Updated  Reviewed/Updated: Reviewed All (Medical, Surgical, Family, Medications, Allergies, Care Teams, Patient Goals)    Allergies (verified) Sulfa antibiotics   Current Medications (verified) Outpatient Encounter Medications as of 05/12/2024  Medication Sig   albuterol  (PROVENTIL  HFA;VENTOLIN  HFA) 108 (90 Base) MCG/ACT inhaler Inhale 1-2 puffs every 6 (six) hours as needed into the lungs for wheezing.   furosemide  (LASIX ) 40 MG tablet Take 40 mg by mouth 2 (two) times daily.   lisinopril  (ZESTRIL ) 20 MG tablet Take 20 mg by mouth daily.   lisinopril -hydrochlorothiazide  (ZESTORETIC ) 10-12.5 MG tablet Take 1 tablet by mouth daily. (Patient taking differently: Take 20 tablets by mouth daily.)   Potassium Chloride  ER 20 MEQ TBCR Take 1 tablet by mouth 2 (two) times daily.   potassium chloride  SA (KLOR-CON  M) 20 MEQ tablet Take 20 mEq by mouth 2 (two) times daily.   furosemide  (LASIX ) 20 MG tablet Take 1 tablet (20 mg total) by mouth daily for 3 days. (Patient not taking: Reported on 05/12/2024)   No facility-administered encounter medications on file as of 05/12/2024.    History: Past Medical History:  Diagnosis Date   Chronic acquired lymphedema    known to specialist in [place]   Dyslipidemia    Gait disorder    Hypertension    Medically noncompliant    Morbid obesity (HCC)    Rx declined   OSA (obstructive sleep apnea)    with rx declined   Osteoarthritis    Past Surgical History:  Procedure Laterality Date   CARPAL TUNNEL RELEASE Bilateral    COLONOSCOPY N/A    circa 2021 in [place]   FOOT ARTHRODESIS, MODIFIED MCBRIDE Left    FOOT SURGERY Right    Toe bone removal and spur removals   HYSTERECTOMY ABDOMINAL WITH SALPINGECTOMY N/A    for noncancerous reasons.  Unclear if Ovaries are intact.   JOINT REPLACEMENT N/A    Right THR, and Left TKR   KNEE SURGERY     ROTATOR CUFF REPAIR Left    SHOULDER SURGERY Right    to remove bone spur   Family History  Problem Relation  Age of Onset   Stroke Mother    Heart attack Father 42       died at 35   Stroke Sister    Social History   Occupational History   Occupation: Retired from physiological scientist, catering manager  Tobacco Use   Smoking status: Never    Passive exposure: Never   Smokeless tobacco: Never  Vaping Use   Vaping status: Never Used  Substance and Sexual Activity   Alcohol use: No   Drug use: No   Sexual activity: Never    Birth control/protection: None   Tobacco Counseling Counseling given: Not Answered  SDOH Screenings   Food Insecurity: No Food Insecurity (05/12/2024)  Housing: Low Risk (05/12/2024)  Transportation Needs: No Transportation Needs (05/12/2024)  Utilities: Not At Risk (05/12/2024)  Depression (PHQ2-9): Low Risk (05/12/2024)  Financial Resource Strain: Not on File (10/17/2021)   Received from [facility]  Physical Activity: Patient Declined (05/12/2024)  Social Connections: Moderately Integrated (05/12/2024)  Stress: No Stress Concern Present (05/12/2024)  Tobacco Use: Low Risk (05/12/2024)  Health Literacy: Adequate Health Literacy (05/12/2024)   See flowsheets for full screening details  Depression Screen PHQ 2 & 9 Depression Scale- Over the past 2 weeks, how often have you been bothered by any of the following problems? Little interest or pleasure in doing things: 0 Feeling down, depressed, or hopeless (PHQ Adolescent also includes...irritable): 1  PHQ-2 Total Score: 1 Trouble falling or staying asleep, or sleeping too much: 0 Feeling tired or having little energy: 0 Poor appetite or overeating (PHQ Adolescent also includes...weight loss): 0 Feeling bad about yourself - or that you are a failure or have let yourself or your family down: 0 Trouble concentrating on things, such as reading the newspaper or watching television (PHQ Adolescent also includes...like school work): 0 Moving or speaking so slowly that other people could have noticed. Or the opposite - being so fidgety or restless that you  have been moving around a lot more than usual: 0 Thoughts that you would be better off dead, or of hurting yourself in some way: 0 PHQ-9 Total Score: 1 If you checked off any problems, how difficult have these problems made it for you to do your work, take care of things at home, or get along with other people?: Not difficult at all  Depression Treatment Depression Interventions/Treatment : [id number] Score <4 Follow-up Not Indicated     Goals Addressed               This Visit's Progress     losing weight (pt-stated)               Objective:    Today's Vitals   05/12/24 1317  BP: 131/81  Weight: (!) 336 lb (152.4 kg)  Height: 5' (1.524 m)   Body mass index is 65.62 kg/m.  Hearing/Vision screen Hearing Screening - Comments:: No difficulties  Vision Screening - Comments:: Wears readers only. Patient declined eye dr Immunizations and Health Maintenance Health Maintenance  Topic Date Due   Medicare Annual Wellness (AWV)  Never done   Hepatitis C Screening  Never done   Mammogram  Never done   Pneumococcal Vaccine: 50+ Years (1 of 1 - PCV) Never done   Zoster Vaccines- Shingrix (1 of 2) Never done   Bone Density Scan  Never done   Influenza Vaccine  11/13/2023   DTaP/Tdap/Td (3 - Td or Tdap) 01/07/2026   Colonoscopy  07/14/2030   Meningococcal B Vaccine  Aged Out   COVID-19 Vaccine  Discontinued        Assessment/Plan:  This is a routine wellness examination for Betty Miranda.  Patient Care Team: Dottie Norleen PHEBE PONCE, MD as PCP - General (Family Medicine) Georgina Norleen SAUNDERS, MD as Referring Physician (Orthopedic Surgery)  I have personally reviewed and noted the following in the patients chart:   Medical and social history Use of alcohol, tobacco or illicit drugs  Current medications and supplements including opioid prescriptions. Functional ability and status Nutritional status Physical activity Advanced directives List of other physicians Hospitalizations,  surgeries, and ER visits in previous 12 months Vitals Screenings to include cognitive, depression, and falls Referrals and appointments  No orders of the defined types were placed in this encounter.  In addition, I have reviewed and discussed with patient certain preventive protocols, quality metrics, and best practice recommendations. A written personalized care plan for preventive services as well as general preventive health recommendations were provided to patient.   Lyle MARLA Right, [place]   05/12/2024   No follow-ups on file.  After Visit Summary: (Declined) Due to this being a telephonic visit, with patients personalized plan was offered to patient but patient Declined AVS at this time   Nurse Notes: patient declined health maintenance at this time  "

## 2024-05-12 NOTE — Patient Instructions (Signed)
 Ms. Chalmers,  Thank you for taking the time for your Medicare Wellness Visit. I appreciate your continued commitment to your health goals. Please review the care plan we discussed, and feel free to reach out if I can assist you further.  Please note that Annual Wellness Visits do not include a physical exam. Some assessments may be limited, especially if the visit was conducted virtually. If needed, we may recommend an in-person follow-up with your provider.  Ongoing Care Seeing your primary care provider every 3 to 6 months helps us  monitor your health and provide consistent, personalized care.   Referrals If a referral was made during today's visit and you haven't received any updates within two weeks, please contact the referred provider directly to check on the status.  Recommended Screenings:  Health Maintenance  Topic Date Due   Medicare Annual Wellness Visit  Never done   Hepatitis C Screening  Never done   Breast Cancer Screening  Never done   Pneumococcal Vaccine for age over 40 (1 of 1 - PCV) Never done   Zoster (Shingles) Vaccine (1 of 2) Never done   Osteoporosis screening with Bone Density Scan  Never done   Flu Shot  11/13/2023   DTaP/Tdap/Td vaccine (3 - Td or Tdap) 01/07/2026   Colon Cancer Screening  07/14/2030   Meningitis B Vaccine  Aged Out   COVID-19 Vaccine  Discontinued       05/12/2024    1:19 PM  Advanced Directives  Does Patient Have a Medical Advance Directive? No  Would patient like information on creating a medical advance directive? No - Patient declined    Vision: Annual vision screenings are recommended for early detection of glaucoma, cataracts, and diabetic retinopathy. These exams can also reveal signs of chronic conditions such as diabetes and high blood pressure.  Dental: Annual dental screenings help detect early signs of oral cancer, gum disease, and other conditions linked to overall health, including heart disease and diabetes.  Please  see the attached documents for additional preventive care recommendations.
